# Patient Record
Sex: Female | Born: 1960 | Hispanic: Yes | Marital: Single | Smoking: Never smoker
Health system: Southern US, Community
[De-identification: ages and names within clinical notes are randomized; demographics above are authoritative.]

## PROBLEM LIST (undated history)

## (undated) DIAGNOSIS — I10 Essential (primary) hypertension: Secondary | ICD-10-CM

## (undated) DIAGNOSIS — E119 Type 2 diabetes mellitus without complications: Secondary | ICD-10-CM

---

## 2017-07-02 DIAGNOSIS — G060 Intracranial abscess and granuloma: Secondary | ICD-10-CM

## 2017-07-02 HISTORY — DX: Intracranial abscess and granuloma: G06.0

## 2017-07-02 HISTORY — PX: CRANIOTOMY: SHX93

## 2017-08-16 NOTE — Discharge Summary (Signed)
 Neurosurgery Discharge Summary  Patient ID: Stephanie [name] [id number] y.o. 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 Cerebral abscess    Past Medical History:  Diagnosis Date  . Cerebral abscess   . Diabetes mellitus (HCC) 2003   Type 2 DM   . Hypertension   . Leg DVT (deep venous thromboembolism), acute, right (HCC)     Discharged Condition: stable  Indications for Admission: Stephanie [name] is a 57 y.o. female with recent right parieto-occipital crani for evacuation of K. Pneumonia abscess on 1/17 presenting with two right pareital intracranial abscess, the deepest of which communicates with the right lateral ventricle causing ventriculitis. On exam she is having bad headaches and has a left lower quadrant field cut.  Hospital Course:  She was admitted on 07/31/17 and was taken emergently to the OR for Right-sided craniotomy for evacuation of recurrent right-sided parietal occipital brain abscess causing brain compression. She tolerated the procedure well without complications. She was transferred to PACU then neuro ICU. Consults were placed to ID as well as Glucose Management Team. There was concern for medical non-compliance. However, family assured staff that patient did not miss any home antibiotic doses. Once her medical issues were controlled, she was moved out of the ICU on 08/02/17. PT/OT/ST consults were obtained. IPR was advised and a consult was placed to [facility] (however, she was later denied due to lack of 24 hour  assistance once home). She developed an acute right calf DVT. Hematology consult was obtained and recommended 3 months of anticoagulation. Patient was started on Lovenox . Consult was placed to dentistry for evaluation of dentition as possible source of infection. Dentistry did feel her teeth were the source of the infection but did eventually extract 4 teeth while she was hospitalized. Therapy continued to work with her. She was entually placed on the difficult to place list with [facility] (but also preferred transfer to [facility] to be closer to family while awaiting SNF placement). Patient was accepted in transfer to [facility] by Dr. Medford Chain and was transferred once a bed was available on 08/21/2017.  Final Endocrine recs:  -FSBG:ac and hs  -Blood glucose target:140-180 mg/dl  -Discontinue home diabetes medications -Diabetes Medication Regimen : Continue basal Lantus insulin   25 units every evening  Increased prandial Lispro insulin  to 18 units pc based on % of meals consumed Continue Lispro correction insulin  2-12 units pc for bg > 180 mg/dl  -Hypoglycemia Protocol -Nutrition: House  diet   Final ID recs:  - Continue on IV ceftriaxone 2g q12 hours to aim targeted therapy for prior detected K. Pneumoniae - Continue PO Flagyl at 500 mg TID for broadened out empiric anaerobic coverage - Duration for drained cerebral abscess is generally treated for 6-8 weeks, may require longer duration given her complicated course. Final duration based upon clinical and radiographic response. (tentative 6 week end date would be 09/11/17).  She would need set up for repeat brain imaging, preferred with contrast, at end of 5th week of IV therapy  to assess response and to see if needs further prolong course.  -She needs to avoid alcohol use while on PO  flagyl Patient would need weekly CBC, CMP monitoring Please contact OPAT nurse (Monday-Friday) when nearing discharge for this information. Phone [phone] or  page [phone].  Procedures/Surgeries performed during hospitalization: Right craniotomy by Dr. Michiel on 07/31/17 Extraction of 4 teeth (#1,2,13, 19) by dentistry on 08/08/17  Discharge Exam: Alert, fully oriented, NAD PERRL Face: symmetric Tongue: midline Moves arms and legs well with full strength throughout Incision: well-healed  Disposition: [facility]  Patient Instructions:  Current Discharge Medication List    START taking these medications   Details  acetaminophen  (TYLENOL ) 325 MG tablet Take 2 tablets (650 mg total) by mouth every 6 (six) hours.    cefTRIAXone 2 g in sodium chloride  0.9 % 0.9 % 100 mL IVPB Infuse 2 g into the vein every 12 hours. Infuse over 30 minutes. Continue through 09/11/2017    enoxaparin  (LOVENOX ) 120 mg/0.8 mL injection *ANTICOAGULANT* Inject 0.8 mLs (120 mg total) into the skin every 12 hours.    insulin  glargine (LANTUS) 100 unit/mL (3 mL) injection Inject 25 Units into the skin every evening.    !! insulin  lispro (HUMALOG) 100 unit/mL injection Inject 2-12 Units into the skin 3 (times) daily after meals. Check blood sugar prior to meal. If patient has eaten prior to finger stick, call provider for insulin  coverage. Give corrective insulin  dose based on FSBS even when there are NO SCHEDULED INSULIN  DOSES.   If patient is NPO, DO NOT HOLD. BG = 180-200      2 units BG = 201-250  5 units      BG = 251-300  7 units BG = 301-350  10 units BG = 351-400  12 units BG > 400  Call provider    !! insulin  lispro (HUMALOG) 100 unit/mL injection Inject 18 Units into the skin 3 (times) daily after meals.    metroNIDAZOLE (FLAGYL) 500 MG tablet Take 1 tablet (500 mg total) by mouth every 8 hours. Through 09/11/2017    !! nystatin (MYCOSTATIN) 100,000 unit/gram cream Apply topically 3 times daily.    !! nystatin (MYCOSTATIN) 100,000 unit/gram cream Apply topically as needed. After each episode of incontinence    potassium chloride  ER  (KLOR-CON -M, K-DUR) 20 MEQ extended release tablet Take 1 tablet (20 mEq total) by mouth daily.    senna-docusate (PERICOLACE, SENOKOT-S) 8.6-50 mg per tablet Take 2 tablets by mouth nightly.     !! - Potential duplicate medications found. Please discuss with provider.    CONTINUE these medications which have CHANGED   Details  levETIRacetam  (KEPPRA ) 500 MG tablet Take 1 tablet (500 mg total) by mouth 2 times daily. Refills: 0    losartan  (COZAAR ) 50 MG tablet Take 0.5 tablets (25 mg total) by mouth daily.      STOP taking these medications     cefTRIAXone (ROCEPHIN) 2 gram injection      HYDROcodone-acetaminophen  (NORCO) 5-325 mg per tablet      ibuprofen (ADVIL,MOTRIN) 200 MG tablet      insulin  NPH (HUMULIN  N,NOVOLIN N) 100 unit/mL injection      metFORMIN (GLUCOPHAGE) 1000 MG tablet           Discharge Orders and Instructions    Diet At Discharge    Complete by:  As directed    Recommended diet at discharge:  Other   Other recommended diet:  diabetic soft mechanical diet   Activity At Discharge    Complete by:  As directed    Recommended activity at discharge:  Activity as tolerated   Contact Us  / Call Us  To Schedule An Appointment    Complete by:  As directed    Call:  The Neurosurgery Office   Phone:  [phone]   Call if:  questions or concerns   Additional Discharge Instructions    Complete by:  As directed    Monitor weekly labs while receiving antibiotics            Future Appointments      Provider Department Dept Phone Center   09/03/2017 3:15 PM [facility] NEUROSURGERY Neurosurgery - 5th fl [place] [phone] JT          Electronically signed by: Stephanie JONETTA Null, PA-C 08/21/17 1549

## 2017-08-21 NOTE — Progress Notes (Signed)
 Neurosurgery Progress Note  Hospital Day: Hospital Day: 22    Subjective:  No acute events overnight, denies HA, reports she has been ambulating  Objective:   Vitals: Temp:  [97.6 F (36.4 C)-98.6 F (37 C)] 98.2 F (36.8 C) Pulse:  [102-119] 115 Resp:  [14-18] 18 BP: (115-141)/(52-89) 141/86 SpO2:  [94 %-99 %] 94 %  Intake/Output Summary (Last 24 hours) at 08/21/17 9078 Last data filed at 08/21/17 0848  Gross per 24 hour  Intake              360 ml  Output                0 ml  Net              360 ml    Physical Exam: Middle aged female, lying in bed in no acute distress Awake, alert and oriented x 4 PERRL, EOMI Face symmetric Strength 5/5 x 4 No drift Incision well healed  Assessment/Plan: 57 y.o. female with recurrent abscess s/p crani, progressing well and awaiting placement  - Will continue current Abx regime - Will obtain CBC and BMP today as part of continued monitoring while on Abx - Continue ambulation - Please call with questions or concerns ([phone] or [phone] after 5pm and on weekends)  Electronically signed by: Rosine Lucas Jacklyn Mickey., MD 08/21/2017 9:23 AM      Electronically signed by: Rosine Lucas Jacklyn Mickey., MD Resident 08/21/17 [phone]

## 2019-06-23 ENCOUNTER — Inpatient Hospital Stay (HOSPITAL_COMMUNITY)
Admission: AD | Admit: 2019-06-23 | Discharge: 2019-07-02 | DRG: 177 | Disposition: A | Discharge status: Home / Self Care | Payer: HRSA Program | Source: Other Acute Inpatient Hospital | Attending: Family Medicine | Admitting: Family Medicine

## 2019-06-23 DIAGNOSIS — Z6841 Body Mass Index (BMI) 40.0 and over, adult: Secondary | ICD-10-CM

## 2019-06-23 DIAGNOSIS — R7401 Elevation of levels of liver transaminase levels: Secondary | ICD-10-CM | POA: Diagnosis not present

## 2019-06-23 DIAGNOSIS — R0600 Dyspnea, unspecified: Secondary | ICD-10-CM

## 2019-06-23 DIAGNOSIS — I5040 Unspecified combined systolic (congestive) and diastolic (congestive) heart failure: Secondary | ICD-10-CM | POA: Diagnosis present

## 2019-06-23 DIAGNOSIS — I2510 Atherosclerotic heart disease of native coronary artery without angina pectoris: Secondary | ICD-10-CM | POA: Diagnosis present

## 2019-06-23 DIAGNOSIS — J9601 Acute respiratory failure with hypoxia: Secondary | ICD-10-CM | POA: Diagnosis present

## 2019-06-23 DIAGNOSIS — J1289 Other viral pneumonia: Secondary | ICD-10-CM | POA: Diagnosis present

## 2019-06-23 DIAGNOSIS — U071 COVID-19: Principal | ICD-10-CM | POA: Diagnosis present

## 2019-06-23 DIAGNOSIS — Z794 Long term (current) use of insulin: Secondary | ICD-10-CM | POA: Diagnosis not present

## 2019-06-23 DIAGNOSIS — Z8661 Personal history of infections of the central nervous system: Secondary | ICD-10-CM

## 2019-06-23 DIAGNOSIS — E1165 Type 2 diabetes mellitus with hyperglycemia: Secondary | ICD-10-CM | POA: Diagnosis not present

## 2019-06-23 DIAGNOSIS — R739 Hyperglycemia, unspecified: Secondary | ICD-10-CM | POA: Diagnosis not present

## 2019-06-23 DIAGNOSIS — T380X5A Adverse effect of glucocorticoids and synthetic analogues, initial encounter: Secondary | ICD-10-CM | POA: Diagnosis not present

## 2019-06-23 DIAGNOSIS — I110 Hypertensive heart disease with heart failure: Secondary | ICD-10-CM | POA: Diagnosis present

## 2019-06-23 DIAGNOSIS — J9690 Respiratory failure, unspecified, unspecified whether with hypoxia or hypercapnia: Secondary | ICD-10-CM

## 2019-06-23 DIAGNOSIS — J1282 Pneumonia due to coronavirus disease 2019: Secondary | ICD-10-CM | POA: Diagnosis present

## 2019-06-23 DIAGNOSIS — E119 Type 2 diabetes mellitus without complications: Secondary | ICD-10-CM | POA: Diagnosis not present

## 2019-06-23 HISTORY — DX: Essential (primary) hypertension: I10

## 2019-06-23 HISTORY — DX: Type 2 diabetes mellitus without complications: E11.9

## 2019-06-23 LAB — CBC WITH DIFFERENTIAL/PLATELET
Abs Immature Granulocytes: 0.02 10*3/uL (ref 0.00–0.07)
Basophils Absolute: 0 10*3/uL (ref 0.0–0.1)
Basophils Relative: 0 %
Eosinophils Absolute: 0 10*3/uL (ref 0.0–0.5)
Eosinophils Relative: 0 %
HCT: 37.6 % (ref 36.0–46.0)
Hemoglobin: 12.4 g/dL (ref 12.0–15.0)
Immature Granulocytes: 0 %
Lymphocytes Relative: 19 %
Lymphs Abs: 0.9 10*3/uL (ref 0.7–4.0)
MCH: 27.9 pg (ref 26.0–34.0)
MCHC: 33 g/dL (ref 30.0–36.0)
MCV: 84.5 fL (ref 80.0–100.0)
Monocytes Absolute: 0.2 10*3/uL (ref 0.1–1.0)
Monocytes Relative: 4 %
Neutro Abs: 3.5 10*3/uL (ref 1.7–7.7)
Neutrophils Relative %: 77 %
Platelets: 196 10*3/uL (ref 150–400)
RBC: 4.45 MIL/uL (ref 3.87–5.11)
RDW: 14.5 % (ref 11.5–15.5)
WBC: 4.6 10*3/uL (ref 4.0–10.5)
nRBC: 0 % (ref 0.0–0.2)

## 2019-06-23 MED ORDER — ENOXAPARIN SODIUM 60 MG/0.6ML ~~LOC~~ SOLN
60.0000 mg | SUBCUTANEOUS | Status: DC
  Administered 2019-06-24 – 2019-07-02 (×9): 60 mg via SUBCUTANEOUS
  Filled 2019-06-23 (×9): qty 0.6

## 2019-06-24 ENCOUNTER — Inpatient Hospital Stay (HOSPITAL_COMMUNITY): Payer: HRSA Program

## 2019-06-24 ENCOUNTER — Other Ambulatory Visit: Payer: Self-pay

## 2019-06-24 ENCOUNTER — Encounter (HOSPITAL_COMMUNITY): Payer: Self-pay | Admitting: Family Medicine

## 2019-06-24 DIAGNOSIS — R7401 Elevation of levels of liver transaminase levels: Secondary | ICD-10-CM | POA: Insufficient documentation

## 2019-06-24 DIAGNOSIS — E119 Type 2 diabetes mellitus without complications: Secondary | ICD-10-CM

## 2019-06-24 DIAGNOSIS — Z794 Long term (current) use of insulin: Secondary | ICD-10-CM

## 2019-06-24 DIAGNOSIS — U071 COVID-19: Principal | ICD-10-CM

## 2019-06-24 DIAGNOSIS — J1289 Other viral pneumonia: Secondary | ICD-10-CM

## 2019-06-24 DIAGNOSIS — Z6841 Body Mass Index (BMI) 40.0 and over, adult: Secondary | ICD-10-CM

## 2019-06-24 LAB — GLUCOSE, CAPILLARY
Glucose-Capillary: 221 mg/dL — ABNORMAL HIGH (ref 70–99)
Glucose-Capillary: 257 mg/dL — ABNORMAL HIGH (ref 70–99)
Glucose-Capillary: 297 mg/dL — ABNORMAL HIGH (ref 70–99)
Glucose-Capillary: 331 mg/dL — ABNORMAL HIGH (ref 70–99)
Glucose-Capillary: 360 mg/dL — ABNORMAL HIGH (ref 70–99)

## 2019-06-24 LAB — HIV ANTIBODY (ROUTINE TESTING W REFLEX): HIV Screen 4th Generation wRfx: NONREACTIVE

## 2019-06-24 LAB — COMPREHENSIVE METABOLIC PANEL
ALT: 72 U/L — ABNORMAL HIGH (ref 0–44)
AST: 78 U/L — ABNORMAL HIGH (ref 15–41)
Albumin: 2.9 g/dL — ABNORMAL LOW (ref 3.5–5.0)
Alkaline Phosphatase: 182 U/L — ABNORMAL HIGH (ref 38–126)
Anion gap: 9 (ref 5–15)
BUN: 7 mg/dL (ref 6–20)
CO2: 22 mmol/L (ref 22–32)
Calcium: 8 mg/dL — ABNORMAL LOW (ref 8.9–10.3)
Chloride: 103 mmol/L (ref 98–111)
Creatinine, Ser: 0.79 mg/dL (ref 0.44–1.00)
GFR calc Af Amer: >60 mL/min (ref >60)
GFR calc non Af Amer: >60 mL/min (ref >60)
Glucose, Bld: 290 mg/dL — ABNORMAL HIGH (ref 70–99)
Potassium: 4 mmol/L (ref 3.5–5.1)
Sodium: 134 mmol/L — ABNORMAL LOW (ref 135–145)
Total Bilirubin: 0.6 mg/dL (ref 0.3–1.2)
Total Protein: 6.5 g/dL (ref 6.5–8.1)

## 2019-06-24 LAB — FERRITIN: Ferritin: 322 ng/mL — ABNORMAL HIGH (ref 11–307)

## 2019-06-24 LAB — D-DIMER, QUANTITATIVE: D-Dimer, Quant: 0.64 ug/mL-FEU — ABNORMAL HIGH (ref 0.00–0.50)

## 2019-06-24 LAB — HEMOGLOBIN A1C
Hgb A1c MFr Bld: 9.2 % — ABNORMAL HIGH (ref 4.8–5.6)
Mean Plasma Glucose: 217.34 mg/dL

## 2019-06-24 LAB — C-REACTIVE PROTEIN: CRP: 16.8 mg/dL — ABNORMAL HIGH (ref <1.0)

## 2019-06-24 LAB — ABO/RH: ABO/RH(D): O NEG

## 2019-06-24 MED ORDER — SODIUM CHLORIDE 0.9 % IV SOLN
100.0000 mg | Freq: Every day | INTRAVENOUS | Status: AC
  Administered 2019-06-24 – 2019-06-27 (×4): 100 mg via INTRAVENOUS
  Filled 2019-06-24 (×4): qty 20

## 2019-06-24 MED ORDER — GUAIFENESIN-DM 100-10 MG/5ML PO SYRP: 5.0000 mL | ORAL_SOLUTION | ORAL | Status: DC | PRN

## 2019-06-24 MED ORDER — ASCORBIC ACID 500 MG PO TABS
500.0000 mg | ORAL_TABLET | Freq: Every day | ORAL | Status: DC
  Administered 2019-06-24 – 2019-07-02 (×9): 500 mg via ORAL
  Filled 2019-06-24 (×9): qty 1

## 2019-06-24 MED ORDER — ORAL CARE MOUTH RINSE
15.0000 mL | Freq: Two times a day (BID) | OROMUCOSAL | Status: DC
  Administered 2019-06-24 – 2019-07-02 (×13): 15 mL via OROMUCOSAL

## 2019-06-24 MED ORDER — ZINC SULFATE 220 (50 ZN) MG PO CAPS
220.0000 mg | ORAL_CAPSULE | Freq: Every day | ORAL | Status: DC
  Administered 2019-06-24 – 2019-07-02 (×9): 220 mg via ORAL
  Filled 2019-06-24 (×9): qty 1

## 2019-06-24 MED ORDER — HYDROCOD POLST-CPM POLST ER 10-8 MG/5ML PO SUER
5.0000 mL | Freq: Two times a day (BID) | ORAL | Status: DC
  Administered 2019-06-24 – 2019-07-02 (×17): 5 mL via ORAL
  Filled 2019-06-24 (×17): qty 5

## 2019-06-24 MED ORDER — SODIUM CHLORIDE 0.9 % IV BOLUS
500.0000 mL | Freq: Once | INTRAVENOUS | Status: AC
  Administered 2019-06-24: 02:00:00 500 mL via INTRAVENOUS

## 2019-06-24 MED ORDER — INSULIN ASPART 100 UNIT/ML ~~LOC~~ SOLN
0.0000 [IU] | Freq: Three times a day (TID) | SUBCUTANEOUS | Status: DC
  Administered 2019-06-24: 15 [IU] via SUBCUTANEOUS
  Administered 2019-06-24: 09:00:00 7 [IU] via SUBCUTANEOUS
  Administered 2019-06-24 – 2019-06-25 (×2): 11 [IU] via SUBCUTANEOUS
  Administered 2019-06-25: 20 [IU] via SUBCUTANEOUS
  Administered 2019-06-25: 17:00:00 15 [IU] via SUBCUTANEOUS
  Administered 2019-06-26: 08:00:00 11 [IU] via SUBCUTANEOUS
  Administered 2019-06-26 (×2): 20 [IU] via SUBCUTANEOUS
  Administered 2019-06-27: 15 [IU] via SUBCUTANEOUS
  Administered 2019-06-27: 11 [IU] via SUBCUTANEOUS
  Administered 2019-06-27: 17:00:00 15 [IU] via SUBCUTANEOUS
  Administered 2019-06-28: 20 [IU] via SUBCUTANEOUS
  Administered 2019-06-28: 4 [IU] via SUBCUTANEOUS
  Administered 2019-06-28 – 2019-06-29 (×2): 11 [IU] via SUBCUTANEOUS
  Administered 2019-06-29: 7 [IU] via SUBCUTANEOUS
  Administered 2019-06-29 – 2019-06-30 (×2): 20 [IU] via SUBCUTANEOUS
  Administered 2019-06-30: 12:00:00 15 [IU] via SUBCUTANEOUS
  Administered 2019-06-30: 7 [IU] via SUBCUTANEOUS
  Administered 2019-07-01: 20 [IU] via SUBCUTANEOUS
  Administered 2019-07-01 – 2019-07-02 (×2): 7 [IU] via SUBCUTANEOUS

## 2019-06-24 MED ORDER — INFLUENZA VAC SPLIT QUAD 0.5 ML IM SUSY
0.5000 mL | PREFILLED_SYRINGE | INTRAMUSCULAR | Status: DC
  Filled 2019-06-24: qty 0.5

## 2019-06-24 MED ORDER — INSULIN ASPART 100 UNIT/ML ~~LOC~~ SOLN
5.0000 [IU] | Freq: Three times a day (TID) | SUBCUTANEOUS | Status: DC
  Administered 2019-06-24 – 2019-06-27 (×9): 5 [IU] via SUBCUTANEOUS

## 2019-06-24 MED ORDER — DEXAMETHASONE SODIUM PHOSPHATE 10 MG/ML IJ SOLN
6.0000 mg | Freq: Every day | INTRAMUSCULAR | Status: DC
  Administered 2019-06-24 – 2019-07-02 (×9): 6 mg via INTRAVENOUS
  Filled 2019-06-24 (×9): qty 1

## 2019-06-24 MED ORDER — INSULIN DETEMIR 100 UNIT/ML ~~LOC~~ SOLN
20.0000 [IU] | Freq: Two times a day (BID) | SUBCUTANEOUS | Status: DC
  Administered 2019-06-24 (×3): 20 [IU] via SUBCUTANEOUS
  Filled 2019-06-24 (×5): qty 0.2

## 2019-06-24 NOTE — H&P (Signed)
History and Physical    Stephanie [name] TDV:[id number] DOB: 1961/01/05 DOA: 06/23/2019  PCP:PCP at [facility]  Patient coming from: Transfer from [facility], initially from home  I have personally briefly reviewed patient's old medical records in [facility]  Chief Complaint: Shortness of breath, COVID pneumonia   HPI: Stephanie [name] is a 58 y.o. female with medical history significant of insulin-dependent type 2 diabetes, hypertension and morbid obesity who presents as a transfer from [facility] for multifocal pneumonia secondary to Covid infection. Tele-Spanish interpreter assisted with HPI.  Patient started to have symptoms about a week ago.  She first noticed headache and daily diarrhea.  Then progressively lost her sense of taste and smell.  She also had persistent fever around 100.1.  She presented to [facility] about 3 days ago and was tested positive for Covid on 12/19.  She done progressively noticed worsening shortness of breath on 12/22 and presented to [facility] where she was found to be hypoxic down to 80s and required up to 2.5 L via nasal cannula. Patient reports that her entire family is Covid positive after gathering with some neighbors last week. She denies tobacco, alcohol or illicit drug use.  She initially presented to [facility] with a temperature of 100, sinus tachycardia of 101, oxygen saturation of 88% on room air and required up to 2.5 L.  Blood pressure of 127/60. CXR showed multifocal pneumonia.  D-dimer was 895, procalcitonin of 0.31.  Review of Systems:  Constitutional: + Fever ENT/Mouth: No sore throat, No Rhinorrhea Eyes: No Vision Changes Cardiovascular: no Chest Pain,+ SOB Respiratory: + Cough,  Gastrointestinal: No Nausea, No Vomiting, No Diarrhea, No Constipation, pain with coughing Genitourinary: no Urinary Incontinence Musculoskeletal: + Myalgias Skin: No Skin Lesions, No Pruritus, Neuro: no Weakness, No  Numbness,   Psych:  no decrease appetite Heme/Lymph: No Bruising, No Bleeding    Prior to Admission medications   Not on File    Physical Exam: Vitals:   06/23/19 2305 06/23/19 2306 06/24/19 0000  BP:  134/67 136/63  Pulse: 82 82 86  Resp: (!) 22 (!) 29 (!) 32  SpO2: 92% 90%   Weight:  123.2 kg   Height:  5' (1.524 m)     Constitutional: NAD, calm, comfortable, morbidly obese female laying flat in bed Vitals:   06/23/19 2305 06/23/19 2306 06/24/19 0000  BP:  134/67 136/63  Pulse: 82 82 86  Resp: (!) 22 (!) 29 (!) 32  SpO2: 92% 90%   Weight:  123.2 kg   Height:  5' (1.524 m)    Eyes: PERRL, lids and conjunctivae normal ENMT: Mucous membranes are moist. Neck: normal, supple Respiratory: clear to auscultation bilaterally, no wheezing, no crackles although difficult to auscultate given body habitus.  On 2 L via nasal cannula and had some dyspnea in between sentences. No accessory muscle use.  Cardiovascular: Regular rate and rhythm, no murmurs / rubs / gallops. No extremity edema.  Abdomen: no tenderness. Bowel sounds positive.  Musculoskeletal: no clubbing / cyanosis. No joint deformity upper and lower extremities. Good ROM, no contractures. Normal muscle tone.  Skin: Healing vesicles from recent shingles infection on left flank Neurologic: CN 2-12 grossly intact. Sensation intact. Strength 5/5 in all 4.  Psychiatric: Normal judgment and insight. Alert and oriented x 3. Normal mood.     Labs on Admission: I have personally reviewed following labs and imaging studies  CBC: Recent Labs  Lab 06/23/19 2329  WBC 4.6  NEUTROABS  3.5  HGB 12.4  HCT 37.6  MCV 84.5  PLT 350   Basic Metabolic Panel: No results for input(s): NA, K, CL, CO2, GLUCOSE, BUN, CREATININE, CALCIUM, MG, PHOS in the last 168 hours. GFR: CrCl cannot be calculated (No successful lab value found.). Liver Function Tests: No results for input(s): AST, ALT, ALKPHOS, BILITOT, PROT, ALBUMIN in the last  168 hours. No results for input(s): LIPASE, AMYLASE in the last 168 hours. No results for input(s): AMMONIA in the last 168 hours. Coagulation Profile: No results for input(s): INR, PROTIME in the last 168 hours. Cardiac Enzymes: No results for input(s): CKTOTAL, CKMB, CKMBINDEX, TROPONINI in the last 168 hours. BNP (last 3 results) No results for input(s): PROBNP in the last 8760 hours. HbA1C: No results for input(s): HGBA1C in the last 72 hours. CBG: No results for input(s): GLUCAP in the last 168 hours. Lipid Profile: No results for input(s): CHOL, HDL, LDLCALC, TRIG, CHOLHDL, LDLDIRECT in the last 72 hours. Thyroid Function Tests: No results for input(s): TSH, T4TOTAL, FREET4, T3FREE, THYROIDAB in the last 72 hours. Anemia Panel: No results for input(s): VITAMINB12, FOLATE, FERRITIN, TIBC, IRON, RETICCTPCT in the last 72 hours. Urine analysis: No results found for: COLORURINE, APPEARANCEUR, LABSPEC, PHURINE, GLUCOSEU, HGBUR, BILIRUBINUR, KETONESUR, PROTEINUR, UROBILINOGEN, NITRITE, LEUKOCYTESUR  Radiological Exams on Admission: No results found.    Assessment/Plan Acute hypoxic respiratory failure secondary to multifocal pneumonia due to COVID infection Continue Decadron-started on 12/22 Continue remdesivir day 2/5   Insulin-dependent type 2 diabetes -Start with 20 units twice daily Levemir and resistance sliding scale while on steroids  Transaminitis -Likely elevated in the setting of Covid infection  Morbid obesity -Complicates comorbidities     DVT prophylaxis:.Lovenox Code Status:Full Family Communication: Plan discussed with patient at bedside  disposition Plan: Home with at least 2 midnight stays  Consults called:  Admission status: inpatient   Damoney Julia T Aritza Brunet DO [facility]   If 7PM-7AM, please contact night-coverage [email or web address] Password [email or web address]  06/24/2019, 12:20 AM

## 2019-06-24 NOTE — Plan of Care (Signed)

## 2019-06-24 NOTE — Progress Notes (Signed)
   06/23/19 2306  MEWS Score  Resp (!) 29  ECG Heart Rate 82  Pulse Rate 82  BP 134/67  SpO2 90 %  O2 Device Nasal Cannula  O2 Flow Rate (L/min) 2 L/min  MEWS Score  MEWS RR 2  MEWS Pulse 0  MEWS Systolic 0  MEWS LOC 0  MEWS Temp 0  MEWS Score 2  MEWS Score Color Yellow  MEWS Assessment  Is this an acute change? Yes  Provider Notification  Provider Name/Title Ileene Musa D.O.  Date Provider Notified 06/24/19  Time Provider Notified 0000  Notification Type Face-to-face  Response See new orders  Date of Provider Response 06/24/19  Time of Provider Response 0000

## 2019-06-24 NOTE — Progress Notes (Signed)
PROGRESS NOTE    Stephanie [name]  XHB:[id number] DOB: 1961-04-16 DOA: 06/23/2019 PCP: No primary care provider on file.    Brief Narrative:  58 year old female who presented with dyspnea.  She does have significant past medical history for type 2 diabetes mellitus, hypertension and morbid obesity.  Patient was transferred from [facility] after a diagnosis of multifocal pneumonia due to SARS COVID-19.  She tested positive for SARS COVID-19 December 19, her symptoms consistent with headache, diarrhea, loss of sense of smell and taste, fever and progressive/worsening dyspnea.  December 22 she was evaluated at [facility], her oximetry was 80%, her temperature was 100 F, heart rate 101, blood pressure 127/60.  She was transferred to [facility] on December 23 for further management at the time of her transfer her blood pressure was 134/67, pulse rate 82, respirate 22-29, oxygen saturation 90% on supplemental oxygen, her lungs are clear to auscultation bilaterally, heart S1-S2 present rhythmic, abdomen was soft, no lower extremity edema.     Assessment & Plan:   Principal Problem:   Pneumonia due to COVID-19 virus Active Problems:   Transaminitis   Type 2 diabetes mellitus without complication, with long-term current use of insulin (HCC)   1.  Acute hypoxic respiratory failure due to SARS COVID-19 viral pneumonia.  RR: 20  Pulse oxymetry: 92%  Fi02: 2 L/ min per [place]  COVID-19 Labs  Recent Labs    06/24/19 1011  DDIMER 0.64*  FERRITIN 322*  CRP 16.8*    No results found for: SARSCOV2NAA  Chest film personally reviewed noted bilateral interstitial infiltrates, right upper and lower lobe and left lower lobe.   Will continue medical therapy with Remdesivir #2/5 (AST 78, ALT 72), continue systemic corticosteroids with dexemethasone. Continue with antitussive agents and airway clearing techniques with flutter valve and incentive spirometer. Continue vitamin c and  zinc.    2.  Uncontrolled T2DM (Hgb A1c 9,2), with steroid induced hyperglycemia. Capillary glucose this am 257, 221, 297, will continue insulin sliding scale and basal insulin 20 units levemir bid. Will add 5 units for meal coverage. Will need further insulin adjustments.   3. Morbid obesity. Will need outpatient follow up. Positive risk factor for severe COVID 19.     DVT prophylaxis: enoxaparin   Code Status:  full Family Communication: no family at the bedside  Disposition Plan/ discharge barriers:  Pending clinical improvement.         Subjective: Patient is feeling better, continue to have dyspnea and cough, low appetite, no nausea or vomiting, no chest pain.   Objective: Vitals:   06/24/19 0430 06/24/19 0534 06/24/19 0536 06/24/19 0800  BP:  (!) 102/45 (!) 102/45 104/87  Pulse: 71 81 79 85  Resp: 20 18 (!) 25 20  Temp:  99.6 F (37.6 C)  98.5 F (36.9 C)  TempSrc:  Oral  Oral  SpO2:  91% 93% 92%  Weight:      Height:        Intake/Output Summary (Last 24 hours) at 06/24/2019 0843 Last data filed at 06/24/2019 0315 Gross per 24 hour  Intake 680.18 ml  Output --  Net 680.18 ml   Filed Weights   06/23/19 2306 06/24/19 0230  Weight: 123.2 kg 123 kg    Examination:   General: Not in pain, mild dyspnea, deconditioned  Neurology: Awake and alert, non focal  E ENT: mild pallor, no icterus, oral mucosa moist Cardiovascular: No JVD. S1-S2 present, rhythmic, no gallops, rubs, or murmurs. Trace  non pitiing lower extremity edema. Pulmonary: positive breath sounds bilaterally. Gastrointestinal. Abdomen with no organomegaly, non tender, no rebound or guarding Skin. No rashes Musculoskeletal: no joint deformities     Data Reviewed: I have personally reviewed following labs and imaging studies  CBC: Recent Labs  Lab 06/23/19 2329  WBC 4.6  NEUTROABS 3.5  HGB 12.4  HCT 37.6  MCV 84.5  PLT 196   Basic Metabolic Panel: Recent Labs  Lab 06/23/19 2329   NA 134*  K 4.0  CL 103  CO2 22  GLUCOSE 290*  BUN 7  CREATININE 0.79  CALCIUM 8.0*   GFR: Estimated Creatinine Clearance: 90.3 mL/min (by C-G formula based on SCr of 0.79 mg/dL). Liver Function Tests: Recent Labs  Lab 06/23/19 2329  AST 78*  ALT 72*  ALKPHOS 182*  BILITOT 0.6  PROT 6.5  ALBUMIN 2.9*   No results for input(s): LIPASE, AMYLASE in the last 168 hours. No results for input(s): AMMONIA in the last 168 hours. Coagulation Profile: No results for input(s): INR, PROTIME in the last 168 hours. Cardiac Enzymes: No results for input(s): CKTOTAL, CKMB, CKMBINDEX, TROPONINI in the last 168 hours. BNP (last 3 results) No results for input(s): PROBNP in the last 8760 hours. HbA1C: Recent Labs    06/23/19 2329  HGBA1C 9.2*   CBG: Recent Labs  Lab 06/24/19 0040 06/24/19 0758  GLUCAP 257* 221*   Lipid Profile: No results for input(s): CHOL, HDL, LDLCALC, TRIG, CHOLHDL, LDLDIRECT in the last 72 hours. Thyroid Function Tests: No results for input(s): TSH, T4TOTAL, FREET4, T3FREE, THYROIDAB in the last 72 hours. Anemia Panel: No results for input(s): VITAMINB12, FOLATE, FERRITIN, TIBC, IRON, RETICCTPCT in the last 72 hours.    Radiology Studies: I have reviewed all of the imaging during this hospital visit personally     Scheduled Meds: . dexamethasone (DECADRON) injection  6 mg Intravenous Daily  . enoxaparin (LOVENOX) injection  60 mg Subcutaneous Q24H  . [START ON 06/25/2019] influenza vac split quadrivalent PF  0.5 mL Intramuscular Tomorrow-1000  . insulin aspart  0-20 Units Subcutaneous TID WC  . insulin detemir  20 Units Subcutaneous BID  . mouth rinse  15 mL Mouth Rinse BID   Continuous Infusions: . remdesivir 100 mg in NS 100 mL 100 mg (06/24/19 0827)     LOS: 1 day        Nicol Herbig Annett Gula, MD

## 2019-06-24 NOTE — Progress Notes (Signed)
   06/24/19 0036  MEWS Score  Temp (!) 101.1 F (38.4 C)  MEWS Score  MEWS RR 2  MEWS Pulse 0  MEWS Systolic 0  MEWS LOC 0  MEWS Temp 1  MEWS Score 3  MEWS Score Color Yellow  MEWS Assessment  Is this an acute change? No  Provider Notification  Provider Name/Title Kennon Holter, NP  Date Provider Notified 06/24/19  Time Provider Notified 0100 ((Approximate, paged x2))  Notification Type Page  Notification Reason Other (Comment) (Temp 101.1. No Tylenol orders.)  Response See new orders (IV NS bolus. No order for Tylenol.)  Date of Provider Response 06/24/19  Time of Provider Response 0154   Will continue to monitor.

## 2019-06-25 LAB — GLUCOSE, CAPILLARY
Glucose-Capillary: 289 mg/dL — ABNORMAL HIGH (ref 70–99)
Glucose-Capillary: 351 mg/dL — ABNORMAL HIGH (ref 70–99)
Glucose-Capillary: 314 mg/dL — ABNORMAL HIGH (ref 70–99)
Glucose-Capillary: 340 mg/dL — ABNORMAL HIGH (ref 70–99)

## 2019-06-25 LAB — FERRITIN: Ferritin: 424 ng/mL — ABNORMAL HIGH (ref 11–307)

## 2019-06-25 LAB — COMPREHENSIVE METABOLIC PANEL
ALT: 88 U/L — ABNORMAL HIGH (ref 0–44)
AST: 101 U/L — ABNORMAL HIGH (ref 15–41)
Albumin: 2.8 g/dL — ABNORMAL LOW (ref 3.5–5.0)
Alkaline Phosphatase: 164 U/L — ABNORMAL HIGH (ref 38–126)
Anion gap: 11 (ref 5–15)
BUN: 18 mg/dL (ref 6–20)
CO2: 22 mmol/L (ref 22–32)
Calcium: 8.5 mg/dL — ABNORMAL LOW (ref 8.9–10.3)
Chloride: 104 mmol/L (ref 98–111)
Creatinine, Ser: 0.81 mg/dL (ref 0.44–1.00)
GFR calc Af Amer: >60 mL/min (ref >60)
GFR calc non Af Amer: >60 mL/min (ref >60)
Glucose, Bld: 343 mg/dL — ABNORMAL HIGH (ref 70–99)
Potassium: 4.4 mmol/L (ref 3.5–5.1)
Sodium: 137 mmol/L (ref 135–145)
Total Bilirubin: 0.2 mg/dL — ABNORMAL LOW (ref 0.3–1.2)
Total Protein: 6.1 g/dL — ABNORMAL LOW (ref 6.5–8.1)

## 2019-06-25 LAB — C-REACTIVE PROTEIN: CRP: 9.6 mg/dL — ABNORMAL HIGH (ref <1.0)

## 2019-06-25 LAB — D-DIMER, QUANTITATIVE: D-Dimer, Quant: 0.5 ug/mL-FEU (ref 0.00–0.50)

## 2019-06-25 MED ORDER — INSULIN DETEMIR 100 UNIT/ML ~~LOC~~ SOLN
30.0000 [IU] | Freq: Two times a day (BID) | SUBCUTANEOUS | Status: DC
  Administered 2019-06-25 – 2019-06-26 (×3): 30 [IU] via SUBCUTANEOUS
  Filled 2019-06-25 (×4): qty 0.3

## 2019-06-25 NOTE — Progress Notes (Addendum)
PROGRESS NOTE    Stephanie [name]  EXH:[id number] DOB: 1960/08/24 DOA: 06/23/2019 PCP: Patient, No Pcp Per    Brief Narrative:  57 year old female who presented with dyspnea.  She does have significant past medical history for type 2 diabetes mellitus, hypertension and morbid obesity.  Patient was transferred from [facility] after a diagnosis of multifocal pneumonia due to SARS COVID-19.    She tested positive for SARS COVID-19 December 19, her symptoms consistent with headache, diarrhea, loss of sense of smell and taste, fever and progressive/worsening dyspnea.  December 22 she was evaluated at [facility], her oximetry was 80%, her temperature was 100 F, heart rate 101, blood pressure 127/60.  She was transferred to [facility] on December 23 for further management at the time of her transfer her blood pressure was 134/67, pulse rate 82, respirate 22-29, oxygen saturation 90% on supplemental oxygen, her lungs were clear to auscultation bilaterally, heart S1-S2 present rhythmic, abdomen was soft, no lower extremity edema.  Chest film personally reviewed noted bilateral interstitial infiltrates, right upper and lower lobe and left lower lobe.  Patient has been responding well to medical therapy with Remdesivir and Dexamethasone.   Assessment & Plan:   Principal Problem:   Pneumonia due to COVID-19 virus Active Problems:   Transaminitis   Type 2 diabetes mellitus without complication, with long-term current use of insulin (HCC)    1.  Acute hypoxic respiratory failure due to SARS COVID-19 viral pneumonia.  RR: 19 to 24  Pulse oxymetry: 96%  Fi02: 4 L/ min  COVID-19 Labs  Recent Labs    06/24/19 1011 06/25/19 0355  DDIMER 0.64* 0.50  FERRITIN 322* 424*  CRP 16.8* 9.6*    No results found for: SARSCOV2NAA   Inflammatory markers trending down, with CRP down to 9,6.  Tolerating well medical therapy with Remdesivir #3/5 (AST 101, ALT 88), on systemic  corticosteroids with dexemethasone 6 mg IV q 24 H. On antitussive agents and airway clearing techniques with flutter valve and incentive spirometer. On vitamin c and zinc.   Out of bed to chair tid with meals and physical/ occupational therapy evaluations.    2.  Uncontrolled T2DM (Hgb A1c 9,2), with steroid induced hyperglycemia. Fasting glucose this am at 343, will increase basal insulin to 30 units bid. Patient is tolerating po well, will continue insulin sliding scale for glucose cover and monitoring and 5 units for pre-meal coverage.  3. Morbid obesity. Positive risk factor for severe COVID 19.  Outpatient follow up.      DVT prophylaxis: enoxaparin   Code Status:  full Family Communication: no family at the bedside  Disposition Plan/ discharge barriers:  Pending clinical improvement   Consultants:     Procedures:     Antimicrobials:       Subjective: Patient slowly improving, improved energy and decreased dyspnea, but not yet back to baseline. Positive cough. Positive new left upper abdominal pain, dull in nature, constant and not affected by meals or movement,   Objective: Vitals:   06/24/19 1241 06/24/19 1540 06/24/19 2000 06/25/19 0630  BP:   130/79 115/61  Pulse:   80 62  Resp:   (!) 26 19  Temp:  98.6 F (37 C) 98.8 F (37.1 C) 98.5 F (36.9 C)  TempSrc:  Oral Oral Oral  SpO2: 90% 94% 96%   Weight:      Height:        Intake/Output Summary (Last 24 hours) at 06/25/2019 3810 Last data filed  at 06/24/2019 2030 Gross per 24 hour  Intake 590.94 ml  Output 300 ml  Net 290.94 ml   Filed Weights   06/23/19 2306 06/24/19 0230  Weight: 123.2 kg 123 kg    Examination:   General: Not in pain or dyspnea, deconditioned  Neurology: Awake and alert, non focal  E ENT: mild pallor, no icterus, oral mucosa moist Cardiovascular: No JVD. S1-S2 present, rhythmic. No lower extremity edema. Pulmonary: Positive breath sounds bilaterally, adequate air movement,  no wheezing, rhonchi or rales. Gastrointestinal. Abdomen protuberant with no organomegaly, non tender, no rebound or guarding Skin. No rashes Musculoskeletal: no joint deformities     Data Reviewed: I have personally reviewed following labs and imaging studies  CBC: Recent Labs  Lab 06/23/19 2329  WBC 4.6  NEUTROABS 3.5  HGB 12.4  HCT 37.6  MCV 84.5  PLT 196   Basic Metabolic Panel: Recent Labs  Lab 06/23/19 2329 06/25/19 0355  NA 134* 137  K 4.0 4.4  CL 103 104  CO2 22 22  GLUCOSE 290* 343*  BUN 7 18  CREATININE 0.79 0.81  CALCIUM 8.0* 8.5*   GFR: Estimated Creatinine Clearance: 89.2 mL/min (by C-G formula based on SCr of 0.81 mg/dL). Liver Function Tests: Recent Labs  Lab 06/23/19 2329 06/25/19 0355  AST 78* 101*  ALT 72* 88*  ALKPHOS 182* 164*  BILITOT 0.6 0.2*  PROT 6.5 6.1*  ALBUMIN 2.9* 2.8*   No results for input(s): LIPASE, AMYLASE in the last 168 hours. No results for input(s): AMMONIA in the last 168 hours. Coagulation Profile: No results for input(s): INR, PROTIME in the last 168 hours. Cardiac Enzymes: No results for input(s): CKTOTAL, CKMB, CKMBINDEX, TROPONINI in the last 168 hours. BNP (last 3 results) No results for input(s): PROBNP in the last 8760 hours. HbA1C: Recent Labs    06/23/19 2329  HGBA1C 9.2*   CBG: Recent Labs  Lab 06/24/19 0040 06/24/19 0758 06/24/19 1235 06/24/19 1703 06/24/19 2107  GLUCAP 257* 221* 297* 331* 360*   Lipid Profile: No results for input(s): CHOL, HDL, LDLCALC, TRIG, CHOLHDL, LDLDIRECT in the last 72 hours. Thyroid Function Tests: No results for input(s): TSH, T4TOTAL, FREET4, T3FREE, THYROIDAB in the last 72 hours. Anemia Panel: Recent Labs    06/24/19 1011 06/25/19 0355  FERRITIN 322* 424*      Radiology Studies: I have reviewed all of the imaging during this hospital visit personally     Scheduled Meds: . vitamin C  500 mg Oral Daily  . chlorpheniramine-HYDROcodone  5 mL Oral  Q12H  . dexamethasone (DECADRON) injection  6 mg Intravenous Daily  . enoxaparin (LOVENOX) injection  60 mg Subcutaneous Q24H  . influenza vac split quadrivalent PF  0.5 mL Intramuscular Tomorrow-1000  . insulin aspart  0-20 Units Subcutaneous TID WC  . insulin aspart  5 Units Subcutaneous TID WC  . insulin detemir  20 Units Subcutaneous BID  . mouth rinse  15 mL Mouth Rinse BID  . zinc sulfate  220 mg Oral Daily   Continuous Infusions: . remdesivir 100 mg in NS 100 mL Stopped (06/24/19 1717)     LOS: 2 days        Malissa Slay Annett Gula, MD

## 2019-06-25 NOTE — Evaluation (Signed)
Physical Therapy Evaluation Patient Details Name: Stephanie [name] MRN: [id number] DOB: 1960/08/02 Today's Date: 06/25/2019   History of Present Illness  58 year old female who presented with dyspnea.  She does have significant past medical history for type 2 diabetes mellitus, brain tumor, hypertension and morbid obesity.  Patient was transferred from [facility] 06/24/19 after a diagnosis of multifocal pneumonia due to SARS COVID-19.    Clinical Impression  PTA pt living with husband in single story home with 1 step to enter. Pt mobilizes in her home environment by holding onto furniture or seated on Rollator. She requires supervision for bathing and assist with cooking. Pt is currently limited in safe mobility by oxygen desaturation on 4L O2 with mobility (see General Comments), as well as  pain with LE in elevated position L>R which contributes to generalized weakness. Pt educated on pursed lipped breathing and Incentive Spirometer to increase oxygenation. PT recommending HHPT at discharge to improve strength and mobility. PT will continue to follow acutely.     Follow Up Recommendations Home health PT;Supervision for mobility/OOB    Equipment Recommendations  3in1 (PT)       Precautions / Restrictions Precautions Precautions: Fall Precaution Comments: 1x due to tumor 2 yrs ago prior to surgery Restrictions Weight Bearing Restrictions: No      Mobility  Bed Mobility               General bed mobility comments: sitting EoB on entry  Transfers Overall transfer level: Needs assistance   Transfers: Sit to/from Stand;Stand Pivot Transfers Sit to Stand: Min guard Stand pivot transfers: Min guard       General transfer comment: min guard for sit to stand from EoB and for pivot transfer to/from [facility]  Ambulation/Gait Ambulation/Gait assistance: Min guard;Min assist Gait Distance (Feet): 16 Feet Assistive device: None Gait Pattern/deviations: Step-through  pattern;Decreased step length - right;Shuffle;Decreased step length - left Gait velocity: slowed Gait velocity interpretation: <1.31 ft/sec, indicative of household ambulator General Gait Details: minA progressing to min guard for slow, waddling gait around foot of bed to recliner     Balance Overall balance assessment: Needs assistance Sitting-balance support: Feet supported;No upper extremity supported Sitting balance-Leahy Scale: Good     Standing balance support: No upper extremity supported;During functional activity Standing balance-Leahy Scale: Fair                               Pertinent Vitals/Pain Pain Assessment: Faces Faces Pain Scale: Hurts little more Pain Location: in popleteal fossa bilateral LE L>R, worse in dependent possition  Pain Descriptors / Indicators: Sharp;Shooting Pain Intervention(s): Limited activity within patient's tolerance;Monitored during session;Repositioned    Home Living Family/patient expects to be discharged to:: Private residence Living Arrangements: Spouse/significant other Available Help at Discharge: Family Type of Home: House Home Access: Stairs to enter   Secretary/administrator of Steps: 1 Home Layout: One level Home Equipment: Environmental consultant - 4 wheels;Cane - single point      Prior Function Level of Independence: Needs assistance   Gait / Transfers Assistance Needed: uses furniture to get around in home and when feeling weak sits on Rollator and propels herself with feet  ADL's / Homemaking Assistance Needed: husband supervises shower and assist with cooking, pt stated she does the bathing and dressing he is there to help her in and out of the shower           Extremity/Trunk Assessment   Upper  Extremity Assessment Upper Extremity Assessment: Defer to OT evaluation    Lower Extremity Assessment Lower Extremity Assessment: RLE deficits/detail;LLE deficits/detail RLE Deficits / Details: Strength grossly assessed at  3+/5, ROM WFL LLE Deficits / Details: Strength grossly assessed at 3+/5, ROM WFL       Communication   Communication: Interpreter utilized(stratus interpreter Jonita Albee [phone])  Cognition Arousal/Alertness: Awake/alert Behavior During Therapy: WFL for tasks assessed/performed Overall Cognitive Status: Within Functional Limits for tasks assessed                                        General Comments General comments (skin integrity, edema, etc.): Pt on 4L O2 via [place] 92%O2 prior to ambulation 87%O2 after ambulation, pt able to recover to 92%O2 with vc for pursed lipped breathing    Exercises Other Exercises Other Exercises: Incentive spirometer x10   Assessment/Plan    PT Assessment Patient needs continued PT services  PT Problem List Decreased strength;Decreased activity tolerance;Decreased balance;Decreased mobility;Decreased safety awareness;Pain       PT Treatment Interventions DME instruction;Gait training;Functional mobility training;Therapeutic activities;Therapeutic exercise;Balance training;Cognitive remediation;Patient/family education    PT Goals (Current goals can be found in the Care Plan section)  Acute Rehab PT Goals Patient Stated Goal: go back to work PT Goal Formulation: With patient Time For Goal Achievement: 07/09/19 Potential to Achieve Goals: Good    Frequency Min 3X/week   Barriers to discharge        Co-evaluation PT/OT/SLP Co-Evaluation/Treatment: Yes Reason for Co-Treatment: For patient/therapist safety PT goals addressed during session: Mobility/safety with mobility         AM-PAC PT "6 Clicks" Mobility  Outcome Measure Help needed turning from your back to your side while in a flat bed without using bedrails?: None Help needed moving from lying on your back to sitting on the side of a flat bed without using bedrails?: A Little Help needed moving to and from a bed to a chair (including a wheelchair)?: None Help needed  standing up from a chair using your arms (e.g., wheelchair or bedside chair)?: None Help needed to walk in hospital room?: A Little Help needed climbing 3-5 steps with a railing? : A Lot 6 Click Score: 20    End of Session Equipment Utilized During Treatment: Gait belt;Oxygen Activity Tolerance: Patient tolerated treatment well Patient left: in chair;with call bell/phone within reach;with chair alarm set Nurse Communication: Mobility status PT Visit Diagnosis: Unsteadiness on feet (R26.81);Muscle weakness (generalized) (M62.81);Difficulty in walking, not elsewhere classified (R26.2);Pain Pain - Right/Left: (bilateral) Pain - part of body: Knee(popliteal fossa)    Time: 1323-1405 PT Time Calculation (min) (ACUTE ONLY): 42 min   Charges:   PT Evaluation $PT Eval Moderate Complexity: 1 Mod PT Treatments $Gait Training: 8-22 mins        Kashton Mcartor B. Migdalia Dk PT, DPT Acute Rehabilitation Services Pager [phone] Office [phone]   [place] 06/25/2019, 3:27 PM

## 2019-06-25 NOTE — Plan of Care (Signed)

## 2019-06-25 NOTE — Evaluation (Signed)
Occupational Therapy Evaluation Patient Details Name: Stephanie [name] MRN: [id number] DOB: 08/05/1960 Today's Date: 06/25/2019    History of Present Illness 58 year old female who presented with dyspnea.  She does have significant past medical history for type 2 diabetes mellitus, brain tumor, hypertension and morbid obesity.  [facility]19 after a diagnosis of multifocal pneumonia due to SARS COVID-19.     Clinical Impression   Pt admitted with above. She demonstrates the below listed deficits and will benefit from continued OT to maximize safety and independence with BADLs.  Pt presents to OT with generalized weakness, and decreased activity tolerance.  She currently requires min guard - min A for ADLs and min guard assist for functional mobility.  02 sats decreased to 87% on 4L with activity.  She reports she lives with her spouse and was mod I with ADLs PTA.       Follow Up Recommendations  No OT follow up;Supervision/Assistance - 24 hour    Equipment Recommendations  Tub/shower bench    Recommendations for Other Services       Precautions / Restrictions Precautions Precautions: Fall Precaution Comments: 1x due to tumor 2 yrs ago prior to surgery Restrictions Weight Bearing Restrictions: No      Mobility Bed Mobility               General bed mobility comments: sitting EoB on entry  Transfers Overall transfer level: Needs assistance   Transfers: Sit to/from Stand;Stand Pivot Transfers Sit to Stand: Min guard Stand pivot transfers: Min guard       General transfer comment: min guard for sit to stand from EoB and for [facility]    Balance Overall balance assessment: Needs assistance Sitting-balance support: Feet supported;No upper extremity supported Sitting balance-Leahy Scale: Good     Standing balance support: No upper extremity supported;During functional activity Standing  balance-Leahy Scale: Fair                             ADL either performed or assessed with clinical judgement   ADL Overall ADL's : Needs assistance/impaired Eating/Feeding: Independent   Grooming: Wash/dry hands;Wash/dry face;Oral care;Brushing hair;Standing   Upper Body Bathing: Set up;Sitting   Lower Body Bathing: Min guard;Sit to/from stand   Upper Body Dressing : Set up;Sitting   Lower Body Dressing: Sit to/from stand;Minimal assistance Lower Body Dressing Details (indicate cue type and reason): Pt unable to don Rt sock due to environment being different than at home.  If she were able to prop Rt foot on bed, she would be able to perform without assist  Toilet Transfer: Min guard;Ambulation;BSC   Toileting- ArchitectClothing Manipulation and Hygiene: Min guard;Sit to/from stand       Functional mobility during ADLs: Min guard       Vision         Perception     Praxis      Pertinent Vitals/Pain Pain Assessment: Faces Faces Pain Scale: Hurts little more Pain Location: in popleteal fossa bilateral LE L>R, worse when elevated  Pain Descriptors / Indicators: Sharp;Shooting Pain Intervention(s): Limited activity within patient's tolerance     Hand Dominance Right   Extremity/Trunk Assessment Upper Extremity Assessment Upper Extremity Assessment: Overall WFL for tasks assessed   Lower Extremity Assessment Lower Extremity Assessment: Defer to PT evaluation RLE Deficits / Details: Strength grossly assessed at 3+/5, ROM WFL LLE Deficits / Details: Strength grossly assessed at  3+/5, ROM WFL   Cervical / Trunk Assessment Cervical / Trunk Assessment: Normal   Communication Communication Communication: Interpreter utilized(stratus interpreter Philis Pique [phone])   Cognition Arousal/Alertness: Awake/alert Behavior During Therapy: WFL for tasks assessed/performed Overall Cognitive Status: Within Functional Limits for tasks assessed                                      General Comments  Pt on 4L O2 via [place] 92%O2 prior to ambulation 87%O2 after ambulation, pt able to recover to 92%O2 with vc for pursed lipped breathing    Exercises Exercises: Other exercises Other Exercises Other Exercises: Incentive spirometer x10 Other Exercises: Pt instructed to perform 10 reps bil. shoulder flexion every hour     Shoulder Instructions      Home Living Family/patient expects to be discharged to:: Private residence Living Arrangements: Spouse/significant other Available Help at Discharge: Family Type of Home: House Home Access: Stairs to enter Secretary/administrator of Steps: 1   Home Layout: One level     Bathroom Shower/Tub: Chief Strategy Officer: Standard Bathroom Accessibility: No   Home Equipment: Environmental consultant - 4 wheels;Cane - single point          Prior Functioning/Environment Level of Independence: Needs assistance  Gait / Transfers Assistance Needed: uses furniture to get around in home and when feeling weak sits on Rollator and propels herself with feet ADL's / Homemaking Assistance Needed: husband supervises shower and assist with cooking, pt stated she does the bathing and dressing he is there to help her in and out of the shower            OT Problem List: Decreased strength;Decreased activity tolerance;Impaired balance (sitting and/or standing);Cardiopulmonary status limiting activity;Obesity;Pain      OT Treatment/Interventions: Self-care/ADL training;DME and/or AE instruction;Therapeutic activities;Patient/family education;Balance training;Therapeutic exercise;Energy conservation    OT Goals(Current goals can be found in the care plan section) Acute Rehab OT Goals Patient Stated Goal: go back to work OT Goal Formulation: With patient Time For Goal Achievement: 07/09/19 Potential to Achieve Goals: Good ADL Goals Pt Will Perform Grooming: with modified independence;standing Pt Will Perform Upper Body  Bathing: with modified independence;sitting Pt Will Perform Lower Body Bathing: with modified independence;sit to/from stand Pt Will Perform Upper Body Dressing: with modified independence;sitting Pt Will Perform Lower Body Dressing: with modified independence;sit to/from stand Pt Will Transfer to Toilet: with modified independence;ambulating;regular height toilet;bedside commode;grab bars Pt Will Perform Toileting - Clothing Manipulation and hygiene: with modified independence;sit to/from stand  OT Frequency: Min 1X/week   Barriers to D/C:            Co-evaluation PT/OT/SLP Co-Evaluation/Treatment: Yes Reason for Co-Treatment: For patient/therapist safety PT goals addressed during session: Mobility/safety with mobility OT goals addressed during session: ADL's and self-care      AM-PAC OT "6 Clicks" Daily Activity     Outcome Measure Help from another person eating meals?: None Help from another person taking care of personal grooming?: A Little Help from another person toileting, which includes using toliet, bedpan, or urinal?: A Little Help from another person bathing (including washing, rinsing, drying)?: A Little Help from another person to put on and taking off regular upper body clothing?: A Little Help from another person to put on and taking off regular lower body clothing?: A Little 6 Click Score: 19   End of Session Equipment Utilized During Treatment: Gait belt;Oxygen Nurse  Communication: Mobility status  Activity Tolerance: Patient tolerated treatment well Patient left: in chair;with call bell/phone within reach;with chair alarm set  OT Visit Diagnosis: Unsteadiness on feet (R26.81)                Time: 1700-1749 OT Time Calculation (min): 42 min Charges:  OT General Charges $OT Visit: 1 Visit OT Evaluation $OT Eval Moderate Complexity: 1 Mod  Nilsa Nutting., OTR/L Acute Rehabilitation Services Pager [phone] Office [phone]   Lucille Passy  M 06/25/2019, 3:43 PM

## 2019-06-26 LAB — COMPREHENSIVE METABOLIC PANEL
ALT: 101 U/L — ABNORMAL HIGH (ref 0–44)
AST: 83 U/L — ABNORMAL HIGH (ref 15–41)
Albumin: 2.7 g/dL — ABNORMAL LOW (ref 3.5–5.0)
Alkaline Phosphatase: 139 U/L — ABNORMAL HIGH (ref 38–126)
Anion gap: 11 (ref 5–15)
BUN: 23 mg/dL — ABNORMAL HIGH (ref 6–20)
CO2: 23 mmol/L (ref 22–32)
Calcium: 8.6 mg/dL — ABNORMAL LOW (ref 8.9–10.3)
Chloride: 106 mmol/L (ref 98–111)
Creatinine, Ser: 0.75 mg/dL (ref 0.44–1.00)
GFR calc Af Amer: >60 mL/min (ref >60)
GFR calc non Af Amer: >60 mL/min (ref >60)
Glucose, Bld: 327 mg/dL — ABNORMAL HIGH (ref 70–99)
Potassium: 4.4 mmol/L (ref 3.5–5.1)
Sodium: 140 mmol/L (ref 135–145)
Total Bilirubin: <0.1 mg/dL — ABNORMAL LOW (ref 0.3–1.2)
Total Protein: 5.9 g/dL — ABNORMAL LOW (ref 6.5–8.1)

## 2019-06-26 LAB — GLUCOSE, CAPILLARY
Glucose-Capillary: 268 mg/dL — ABNORMAL HIGH (ref 70–99)
Glucose-Capillary: 351 mg/dL — ABNORMAL HIGH (ref 70–99)
Glucose-Capillary: 417 mg/dL — ABNORMAL HIGH (ref 70–99)
Glucose-Capillary: 439 mg/dL — ABNORMAL HIGH (ref 70–99)
Glucose-Capillary: 418 mg/dL — ABNORMAL HIGH (ref 70–99)

## 2019-06-26 LAB — FERRITIN: Ferritin: 305 ng/mL (ref 11–307)

## 2019-06-26 LAB — D-DIMER, QUANTITATIVE: D-Dimer, Quant: 0.49 ug/mL-FEU (ref 0.00–0.50)

## 2019-06-26 LAB — C-REACTIVE PROTEIN: CRP: 4.8 mg/dL — ABNORMAL HIGH (ref <1.0)

## 2019-06-26 MED ORDER — INSULIN ASPART 100 UNIT/ML ~~LOC~~ SOLN
12.0000 [IU] | Freq: Once | SUBCUTANEOUS | Status: AC
  Administered 2019-06-26: 22:00:00 12 [IU] via SUBCUTANEOUS

## 2019-06-26 MED ORDER — INSULIN DETEMIR 100 UNIT/ML ~~LOC~~ SOLN
40.0000 [IU] | Freq: Two times a day (BID) | SUBCUTANEOUS | Status: DC
  Administered 2019-06-26 – 2019-06-29 (×6): 40 [IU] via SUBCUTANEOUS
  Filled 2019-06-26 (×8): qty 0.4

## 2019-06-26 NOTE — Progress Notes (Signed)
PROGRESS NOTE    Stephanie [name]  QIW:[id number] DOB: Apr 29, 1961 DOA: 06/23/2019 PCP: Patient, No Pcp Per    Brief Narrative:  58 year old female who presented with dyspnea. She does have significant past medical history for type 2 diabetes mellitus, hypertension and morbid obesity. Patient was transferred from [facility] after a diagnosis of multifocal pneumonia due to SARS COVID-19.   She tested positive for SARS COVID-19 December 19, her symptoms consistent with headache, diarrhea, loss of sense of smell and taste, fever and progressive/worsening dyspnea. December 22 she was evaluated at [facility], her oximetry was 80%, her temperature was 100 F, heart rate 101, blood pressure 127/60.  She was transferred to [facility] on December 23 for further management at the time of her transfer her blood pressure was 134/67, pulse rate 82, respirate 22-29, oxygen saturation 90% on supplemental oxygen, her lungs were clear to auscultation bilaterally, heart S1-S2 present rhythmic, abdomen was soft, no lower extremity edema.  Chest film personally reviewed noted bilateral interstitial infiltrates, right upper and lower lobe and left lower lobe.  Patient has been responding well to medical therapy with Remdesivir and Dexamethasone.    Assessment & Plan:   Principal Problem:   Pneumonia due to COVID-19 virus Active Problems:   Transaminitis   Type 2 diabetes mellitus without complication, with long-term current use of insulin (HCC)   1.Acute hypoxic respiratory failure due to SARS COVID-19 viral pneumonia.  RR: 20  Pulse oxymetry: 93%  Fi02: 3 L/ min per [place]   COVID-19 Labs  Recent Labs    06/24/19 1011 06/25/19 0355 06/26/19 0309  DDIMER 0.64* 0.50 0.49  FERRITIN 322* 424* 305  CRP 16.8* 9.6* 4.8*    No results found for: SARSCOV2NAA   Continue Inflammatory markers trending down.   Continue medical therapy with Remdesivir #4/5 (AST 83, ALT  101), continue with systemic corticosteroids with dexemethasone 6 mg IV q 24 H. Continue with antitussive agents and airway clearing techniques with flutter valve and incentive spirometer. Continue with vitamin c and zinc.   Patient will need home health PT at discharge, and will also need a ambulatory oxymetry on room air before discharge.   2. Uncontrolled T2DM (Hgb A1c 9,2), with steroid induced hyperglycemia. Fasting glucose this am at 327, on basal insulin to 30 units bid, insulin sliding scale for glucose cover and monitoring and 5 units for pre-meal coverage. Will increase to 40 bid per her home regimen. At discharge will resume oral hypoglycemic agents metformin and januvia.    3. Morbid obesity. Positive risk factor for severe COVID 19.  Will need life style modifications.   DVT prophylaxis:enoxaparin Code Status:full Family Communication:no family at the bedside Disposition Plan/ discharge barriers:Possible dc in am.     Subjective: Patient continue to improve but not yet back to baseline, energy and dyspnea are improving, no chest pain, no nausea or vomiting, has been out of bed with positive dyspnea on exertion.   Objective: Vitals:   06/25/19 1800 06/25/19 2150 06/26/19 0314 06/26/19 0559  BP:  105/72  (!) 156/69  Pulse:  68 (!) 57 62  Resp:  20  20  Temp:  98.5 F (36.9 C)  98.7 F (37.1 C)  TempSrc:  Oral  Oral  SpO2: 93% 96%  93%  Weight:   119 kg   Height:        Intake/Output Summary (Last 24 hours) at 06/26/2019 0930 Last data filed at 06/25/2019 1820 Gross per 24 hour  Intake  480 ml  Output --  Net 480 ml   Filed Weights   06/23/19 2306 06/24/19 0230 06/26/19 0314  Weight: 123.2 kg 123 kg 119 kg    Examination:   General: Not in pain or dyspnea, deconditioned  Neurology: Awake and alert, non focal  E ENT: mild pallor, no icterus, oral mucosa moist Cardiovascular: No JVD. S1-S2 present, No lower extremity edema. Pulmonary:  positive breath sounds bilaterally. Gastrointestinal. Abdomen flat, no organomegaly, non tender, no rebound or guarding Skin. No rashes Musculoskeletal: no joint deformities     Data Reviewed: I have personally reviewed following labs and imaging studies  CBC: Recent Labs  Lab 06/23/19 2329  WBC 4.6  NEUTROABS 3.5  HGB 12.4  HCT 37.6  MCV 84.5  PLT 500   Basic Metabolic Panel: Recent Labs  Lab 06/23/19 2329 06/25/19 0355 06/26/19 0309  NA 134* 137 140  K 4.0 4.4 4.4  CL 103 104 106  CO2 22 22 23   GLUCOSE 290* 343* 327*  BUN 7 18 23*  CREATININE 0.79 0.81 0.75  CALCIUM 8.0* 8.5* 8.6*   GFR: Estimated Creatinine Clearance: 88.4 mL/min (by C-G formula based on SCr of 0.75 mg/dL). Liver Function Tests: Recent Labs  Lab 06/23/19 2329 06/25/19 0355 06/26/19 0309  AST 78* 101* 83*  ALT 72* 88* 101*  ALKPHOS 182* 164* 139*  BILITOT 0.6 0.2* <0.1*  PROT 6.5 6.1* 5.9*  ALBUMIN 2.9* 2.8* 2.7*   No results for input(s): LIPASE, AMYLASE in the last 168 hours. No results for input(s): AMMONIA in the last 168 hours. Coagulation Profile: No results for input(s): INR, PROTIME in the last 168 hours. Cardiac Enzymes: No results for input(s): CKTOTAL, CKMB, CKMBINDEX, TROPONINI in the last 168 hours. BNP (last 3 results) No results for input(s): PROBNP in the last 8760 hours. HbA1C: Recent Labs    06/23/19 2329  HGBA1C 9.2*   CBG: Recent Labs  Lab 06/25/19 0810 06/25/19 1200 06/25/19 1703 06/25/19 2152 06/26/19 0803  GLUCAP 289* 351* 314* 340* 268*   Lipid Profile: No results for input(s): CHOL, HDL, LDLCALC, TRIG, CHOLHDL, LDLDIRECT in the last 72 hours. Thyroid Function Tests: No results for input(s): TSH, T4TOTAL, FREET4, T3FREE, THYROIDAB in the last 72 hours. Anemia Panel: Recent Labs    06/25/19 0355 06/26/19 0309  FERRITIN 424* 305      Radiology Studies: I have reviewed all of the imaging during this hospital visit  personally     Scheduled Meds: . vitamin C  500 mg Oral Daily  . chlorpheniramine-HYDROcodone  5 mL Oral Q12H  . dexamethasone (DECADRON) injection  6 mg Intravenous Daily  . enoxaparin (LOVENOX) injection  60 mg Subcutaneous Q24H  . influenza vac split quadrivalent PF  0.5 mL Intramuscular Tomorrow-1000  . insulin aspart  0-20 Units Subcutaneous TID WC  . insulin aspart  5 Units Subcutaneous TID WC  . insulin detemir  30 Units Subcutaneous BID  . mouth rinse  15 mL Mouth Rinse BID  . zinc sulfate  220 mg Oral Daily   Continuous Infusions: . remdesivir 100 mg in NS 100 mL 100 mg (06/26/19 0919)     LOS: 3 days        Margy Sumler Gerome Apley, MD

## 2019-06-27 DIAGNOSIS — R739 Hyperglycemia, unspecified: Secondary | ICD-10-CM

## 2019-06-27 LAB — COMPREHENSIVE METABOLIC PANEL
ALT: 112 U/L — ABNORMAL HIGH (ref 0–44)
AST: 56 U/L — ABNORMAL HIGH (ref 15–41)
Albumin: 2.6 g/dL — ABNORMAL LOW (ref 3.5–5.0)
Alkaline Phosphatase: 123 U/L (ref 38–126)
Anion gap: 7 (ref 5–15)
BUN: 21 mg/dL — ABNORMAL HIGH (ref 6–20)
CO2: 25 mmol/L (ref 22–32)
Calcium: 8.2 mg/dL — ABNORMAL LOW (ref 8.9–10.3)
Chloride: 109 mmol/L (ref 98–111)
Creatinine, Ser: 0.75 mg/dL (ref 0.44–1.00)
GFR calc Af Amer: >60 mL/min (ref >60)
GFR calc non Af Amer: >60 mL/min (ref >60)
Glucose, Bld: 322 mg/dL — ABNORMAL HIGH (ref 70–99)
Potassium: 4.3 mmol/L (ref 3.5–5.1)
Sodium: 141 mmol/L (ref 135–145)
Total Bilirubin: 0.3 mg/dL (ref 0.3–1.2)
Total Protein: 5.8 g/dL — ABNORMAL LOW (ref 6.5–8.1)

## 2019-06-27 LAB — C-REACTIVE PROTEIN: CRP: 2.1 mg/dL — ABNORMAL HIGH (ref <1.0)

## 2019-06-27 LAB — D-DIMER, QUANTITATIVE: D-Dimer, Quant: 0.49 ug/mL-FEU (ref 0.00–0.50)

## 2019-06-27 LAB — FERRITIN: Ferritin: 205 ng/mL (ref 11–307)

## 2019-06-27 LAB — GLUCOSE, CAPILLARY
Glucose-Capillary: 287 mg/dL — ABNORMAL HIGH (ref 70–99)
Glucose-Capillary: 314 mg/dL — ABNORMAL HIGH (ref 70–99)
Glucose-Capillary: 347 mg/dL — ABNORMAL HIGH (ref 70–99)
Glucose-Capillary: 311 mg/dL — ABNORMAL HIGH (ref 70–99)

## 2019-06-27 MED ORDER — INSULIN ASPART 100 UNIT/ML ~~LOC~~ SOLN
10.0000 [IU] | Freq: Three times a day (TID) | SUBCUTANEOUS | Status: DC
  Administered 2019-06-27 – 2019-07-01 (×12): 10 [IU] via SUBCUTANEOUS

## 2019-06-27 NOTE — Progress Notes (Signed)
PROGRESS NOTE    Stephanie [name]  PNT:[id number] DOB: 08-18-1960 DOA: 06/23/2019 PCP: Patient, No Pcp Per    Brief Narrative:  58 year old female who presented with dyspnea. She does have significant past medical history for type 2 diabetes mellitus, hypertension and morbid obesity. Patient was transferred from [facility] after a diagnosis of multifocal pneumonia due to SARS COVID-19.  She tested positive for SARS COVID-19 December 19, her symptoms consistent with headache, diarrhea, loss of sense of smell and taste, fever and progressive/worsening dyspnea. December 22 she was evaluated at [facility], her oximetry was 80%, her temperature was 100 F, heart rate 101, blood pressure 127/60. She was transferred to [facility] on December 23 for further management. At the time of her transfer her blood pressure was 134/67, pulse rate 82, respiratory rate 22-29, oxygen saturation 90% on supplemental oxygen, her lungswere clear to auscultation bilaterally, heart S1-S2 present rhythmic, abdomen was soft, no lower extremity edema.  Chest film personally reviewed noted bilateral interstitial infiltrates, right upper and lower lobe and left lower lobe.  Patient responding well to medical therapy with Remdesivir and Dexamethasone, but continue to have increase oxygen requirements, fatigue and cough.    Assessment & Plan:   Principal Problem:   Pneumonia due to COVID-19 virus Active Problems:   Type 2 diabetes mellitus without complication, with long-term current use of insulin (HCC)   Morbid obesity ([place])   1.  Acute hypoxic respiratory failure due to SARS COVID-19 viral pneumonia. Patient was admitted to the medical ward, she received supplemental oxygen per nasal cannula, medical therapy with remdesivir #5/5, will continue with dexamethasone 6 mg IV q 24 while hospitalized.  She received antitussive agents and airway clearing techniques with flutter valve incentive  spirometer.  Her symptoms and inflammatory markers improved.  Her oximetry is 93% 4 L/ min.  Patient will have amatory oximetry on room air before discharge, to assess the need of home oxygen.  Mild elevation of AST and ALT related to acute viral process.   COVID-19 Labs  Recent Labs    06/25/19 0355 06/26/19 0309 06/27/19 0500  DDIMER 0.50 0.49 0.49  FERRITIN 424* 305 205  CRP 9.6* 4.8* 2.1*    No results found for: SARSCOV2NAA    2.  Uncontrolled type 2 diabetes mellitus (hemoglobin G8Q 9.2), complicated with steroid-induced hyperglycemia.  Patient receiving insulin therapy: Basal, premeal and sliding scale.    Glucose continue to be elevated, fasting 322, used 60 units from sliding scale. Will increase meal coverage to 10 units and continue basal with 40 units bid of detemir.   3.  Morbid obesity.  Calculated BMI 51.1.  Patient will need further outpatient lifestyle modifications.  Patient was seen by physical therapy, wth recommendations for home health services.   DVT prophylaxis: enoxaparin   Code Status:  full Family Communication: no family at the bedside  Disposition Plan/ discharge barriers: plan for possible dc 06/28/19 home with home health services, and likely home 02.    Subjective: Patient feeling better but not yet back to her baseline, continue to have significant fatigue and dyspnea on exertion. Positive cough and fatigue.   Objective: Vitals:   06/26/19 1428 06/26/19 1600 06/27/19 0429 06/27/19 0551  BP: (!) 114/43 122/62  (!) 116/55  Pulse: 66 66  (!) 58  Resp: 20   18  Temp: 98.1 F (36.7 C)   98.4 F (36.9 C)  TempSrc: Oral   Oral  SpO2: 97%   93%  Weight:  118.8 kg   Height:        Intake/Output Summary (Last 24 hours) at 06/27/2019 1432 Last data filed at 06/27/2019 0948 Gross per 24 hour  Intake 400 ml  Output 600 ml  Net -200 ml   Filed Weights   06/24/19 0230 06/26/19 0314 06/27/19 0429  Weight: 123 kg 119 kg 118.8 kg     Examination:   General: positive dyspnea and deconditioned  Neurology: Awake and alert, non focal  E ENT: mild pallor, no icterus, oral mucosa moist Cardiovascular: No JVD. S1-S2 present, rhythmic. No lower extremity edema. Pulmonary: positive breath sounds bilaterally. Gastrointestinal. Abdomen with, no organomegaly, non tender, no rebound or guarding Skin. No rashes Musculoskeletal: no joint deformities     Data Reviewed: I have personally reviewed following labs and imaging studies  CBC: Recent Labs  Lab 06/23/19 2329  WBC 4.6  NEUTROABS 3.5  HGB 12.4  HCT 37.6  MCV 84.5  PLT 196   Basic Metabolic Panel: Recent Labs  Lab 06/23/19 2329 06/25/19 0355 06/26/19 0309 06/27/19 0500  NA 134* 137 140 141  K 4.0 4.4 4.4 4.3  CL 103 104 106 109  CO2 22 22 23 25   GLUCOSE 290* 343* 327* 322*  BUN 7 18 23* 21*  CREATININE 0.79 0.81 0.75 0.75  CALCIUM 8.0* 8.5* 8.6* 8.2*   GFR: Estimated Creatinine Clearance: 88.3 mL/min (by C-G formula based on SCr of 0.75 mg/dL). Liver Function Tests: Recent Labs  Lab 06/23/19 2329 06/25/19 0355 06/26/19 0309 06/27/19 0500  AST 78* 101* 83* 56*  ALT 72* 88* 101* 112*  ALKPHOS 182* 164* 139* 123  BILITOT 0.6 0.2* <0.1* 0.3  PROT 6.5 6.1* 5.9* 5.8*  ALBUMIN 2.9* 2.8* 2.7* 2.6*   No results for input(s): LIPASE, AMYLASE in the last 168 hours. No results for input(s): AMMONIA in the last 168 hours. Coagulation Profile: No results for input(s): INR, PROTIME in the last 168 hours. Cardiac Enzymes: No results for input(s): CKTOTAL, CKMB, CKMBINDEX, TROPONINI in the last 168 hours. BNP (last 3 results) No results for input(s): PROBNP in the last 8760 hours. HbA1C: No results for input(s): HGBA1C in the last 72 hours. CBG: Recent Labs  Lab 06/26/19 1652 06/26/19 1654 06/26/19 2141 06/27/19 0831 06/27/19 1202  GLUCAP 417* 439* 418* 287* 314*   Lipid Profile: No results for input(s): CHOL, HDL, LDLCALC, TRIG, CHOLHDL,  LDLDIRECT in the last 72 hours. Thyroid Function Tests: No results for input(s): TSH, T4TOTAL, FREET4, T3FREE, THYROIDAB in the last 72 hours. Anemia Panel: Recent Labs    06/26/19 0309 06/27/19 0500  FERRITIN 305 205      Radiology Studies: I have reviewed all of the imaging during this hospital visit personally     Scheduled Meds: . vitamin C  500 mg Oral Daily  . chlorpheniramine-HYDROcodone  5 mL Oral Q12H  . dexamethasone (DECADRON) injection  6 mg Intravenous Daily  . enoxaparin (LOVENOX) injection  60 mg Subcutaneous Q24H  . influenza vac split quadrivalent PF  0.5 mL Intramuscular Tomorrow-1000  . insulin aspart  0-20 Units Subcutaneous TID WC  . insulin aspart  5 Units Subcutaneous TID WC  . insulin detemir  40 Units Subcutaneous BID  . mouth rinse  15 mL Mouth Rinse BID  . zinc sulfate  220 mg Oral Daily   Continuous Infusions:   LOS: 4 days        Cobain Morici 06/29/19, MD

## 2019-06-28 ENCOUNTER — Inpatient Hospital Stay (HOSPITAL_COMMUNITY): Payer: HRSA Program

## 2019-06-28 DIAGNOSIS — R0600 Dyspnea, unspecified: Secondary | ICD-10-CM

## 2019-06-28 LAB — GLUCOSE, CAPILLARY
Glucose-Capillary: 198 mg/dL — ABNORMAL HIGH (ref 70–99)
Glucose-Capillary: 262 mg/dL — ABNORMAL HIGH (ref 70–99)
Glucose-Capillary: 410 mg/dL — ABNORMAL HIGH (ref 70–99)
Glucose-Capillary: 357 mg/dL — ABNORMAL HIGH (ref 70–99)

## 2019-06-28 LAB — FERRITIN: Ferritin: 178 ng/mL (ref 11–307)

## 2019-06-28 LAB — C-REACTIVE PROTEIN: CRP: 1.5 mg/dL — ABNORMAL HIGH (ref <1.0)

## 2019-06-28 LAB — D-DIMER, QUANTITATIVE: D-Dimer, Quant: 0.83 ug/mL-FEU — ABNORMAL HIGH (ref 0.00–0.50)

## 2019-06-28 MED ORDER — FUROSEMIDE 10 MG/ML IJ SOLN
40.0000 mg | Freq: Two times a day (BID) | INTRAMUSCULAR | Status: DC
  Administered 2019-06-28 – 2019-06-30 (×4): 40 mg via INTRAVENOUS
  Filled 2019-06-28 (×4): qty 4

## 2019-06-28 NOTE — Progress Notes (Signed)
Patient 87% on 4L [place] ambulating in hallway, very short of breath. She stated she did not feel comfortable discharging today and requesting one more evening.

## 2019-06-28 NOTE — Progress Notes (Signed)
Patient is resting comfortably. No complaints. Alert/orientation x4. Up ad lib in room. On 4L O2 sat 94% on 4L at rest. No s/s distress noted.

## 2019-06-28 NOTE — Plan of Care (Signed)

## 2019-06-28 NOTE — Progress Notes (Signed)
[facility]  PROGRESS NOTE  Stephanie [name] VXB:[id number] DOB: [birth year] DOA: 06/23/2019 PCP: Patient, No Pcp Per   Brief HPI:   58 year old female with a history of type 2 diabetes mellitus, hypertension, morbid obesity who was transferred from [facility] diagnosis of multifocal pneumonia due to SARS COVID-19.  She had tested positive for SARS COVID-19 on December 19, her symptom consisted of headache, diarrhea, loss of sense of smell and taste, fever, progressive/worsening dyspnea.  She was transferred to [facility] on December 23 for further management.  Chest x-ray showed bilateral interstitial infiltrates.  She was started on remdesivir and Decadron.    Subjective   Patient seen and examined, continues to have dyspnea, she has completed remdesivir and Decadron.  Inflammatory markers have significantly improved.  Chest x-ray obtained today also shows improvement.   Assessment/Plan:     1. Acute hypoxic respiratory failure-due to SARS COVID-19 pneumonia.  Patient was started on remdesivir and Decadron and has completed 5 days of remdesivir in the hospital.  Her inflammatory markers show significant improvement.  Patient is still requiring 4 L/min of oxygen, chest x-ray shows improvement but still has infiltrates bilaterally.  At this time her respiratory failure is likely from underlying CHF. Will her on Lasix 40 mg IV every 12 hours.  Will assess patient's response to diuresis.  Obtain echocardiogram.  She is +3 L fluid overload.  2. Diabetes mellitus type 2, uncontrolled due to steroid induced hyperglycemia-continue sliding scale insulin with NovoLog, Levemir 40 units subcu twice a day, meal coverage 10 units 3 times a day      COVID-19 Labs  Recent Labs    06/26/19 0309 06/27/19 0500 06/28/19 0837  DDIMER 0.49 0.49 0.83*  FERRITIN 305 205 178  CRP 4.8* 2.1* 1.5*    No results found for: SARSCOV2NAA  CBG: Recent Labs  Lab  06/27/19 1202 06/27/19 1648 06/27/19 2130 06/28/19 0813 06/28/19 1152  GLUCAP 314* 347* 311* 198* 262*    CBC: Recent Labs  Lab 06/23/19 2329  WBC 4.6  NEUTROABS 3.5  HGB 12.4  HCT 37.6  MCV 84.5  PLT 196    Basic Metabolic Panel: Recent Labs  Lab 06/23/19 2329 06/25/19 0355 06/26/19 0309 06/27/19 0500  NA 134* 137 140 141  K 4.0 4.4 4.4 4.3  CL 103 104 106 109  CO2 22 22 23 25   GLUCOSE 290* 343* 327* 322*  BUN 7 18 23* 21*  CREATININE 0.79 0.81 0.75 0.75  CALCIUM 8.0* 8.5* 8.6* 8.2*       DVT prophylaxis: Lovenox  Code Status: Full code  Family Communication: No family at bedside  Disposition Plan: likely home when medically ready for discharge        BMI  Estimated body mass index is 51.15 kg/m as calculated from the following:   Height as of this encounter: 5' (1.524 m).   Weight as of this encounter: 118.8 kg.  Scheduled medications:  . vitamin C  500 mg Oral Daily  . chlorpheniramine-HYDROcodone  5 mL Oral Q12H  . dexamethasone (DECADRON) injection  6 mg Intravenous Daily  . enoxaparin (LOVENOX) injection  60 mg Subcutaneous Q24H  . furosemide  40 mg Intravenous Q12H  . influenza vac split quadrivalent PF  0.5 mL Intramuscular Tomorrow-1000  . insulin aspart  0-20 Units Subcutaneous TID WC  . insulin aspart  10 Units Subcutaneous TID WC  . insulin detemir  40 Units Subcutaneous BID  . mouth rinse  15 mL  Mouth Rinse BID  . zinc sulfate  220 mg Oral Daily     Antibiotics:   Anti-infectives (From admission, onward)   Start     Dose/Rate Route Frequency Ordered Stop   06/24/19 1000  remdesivir 100 mg in sodium chloride 0.9 % 100 mL IVPB     100 mg 200 mL/hr over 30 Minutes Intravenous Daily 06/24/19 0042 06/28/19 0743       Objective   Vitals:   06/28/19 1200 06/28/19 1212 06/28/19 1330 06/28/19 1400  BP: (!) 140/56 140/65 (!) 140/56   Pulse: 75 71 84   Resp: 18  (!) 24 18  Temp: 98.5 F (36.9 C)     TempSrc: Oral      SpO2: 94%     Weight:      Height:        Intake/Output Summary (Last 24 hours) at 06/28/2019 1545 Last data filed at 06/28/2019 1400 Gross per 24 hour  Intake 797 ml  Output 650 ml  Net 147 ml   Filed Weights   06/24/19 0230 06/26/19 0314 06/27/19 0429  Weight: 123 kg 119 kg 118.8 kg     Physical Examination:    General: Appears in no acute distress  Cardiovascular: S1-S2, regular, no murmur auscultated  Respiratory: Decreased breath sounds at lung bases  Abdomen: Abdomen is soft, nontender, no organomegaly  Extremities: No edema in the lower extremities  Neurologic: Cranial nerves II through XII grossly intact, no focal deficit noted     Data Reviewed: I have personally reviewed following labs and imaging studies   No results found for this or any previous visit (from the past 240 hour(s)).   Liver Function Tests: Recent Labs  Lab 06/23/19 2329 06/25/19 0355 06/26/19 0309 06/27/19 0500  AST 78* 101* 83* 56*  ALT 72* 88* 101* 112*  ALKPHOS 182* 164* 139* 123  BILITOT 0.6 0.2* <0.1* 0.3  PROT 6.5 6.1* 5.9* 5.8*  ALBUMIN 2.9* 2.8* 2.7* 2.6*      Studies: CXR Portable same day  Result Date: 06/28/2019 CLINICAL DATA:  Respiratory failure.  COVID-19. EXAM: PORTABLE CHEST 1 VIEW COMPARISON:  06/24/2019 FINDINGS: 1058 hours. Low lung volumes. Slight interval decrease in patchy ill-defined bilateral airspace disease right slightly more than left. No pleural effusion. Cardiopericardial silhouette is at upper limits of normal for size. The visualized bony structures of the thorax are intact. IMPRESSION: Asymmetric ill-defined patchy airspace disease in the mid and lower lungs appears slightly improved in the interval. Electronically Signed   By: Misty Stanley M.D.   On: 06/28/2019 11:23     Admission status: Inpatient: Based on patients clinical presentation and evaluation of above clinical data, I have made determination that patient meets Inpatient  criteria at this time.   Oswald Hillock   [facility] If 7PM-7AM, please contact night-coverage at [email or web address], Office  [phone]  password [email or web address]  06/28/2019, 3:45 PM  LOS: 5 days

## 2019-06-29 ENCOUNTER — Inpatient Hospital Stay (HOSPITAL_COMMUNITY): Payer: HRSA Program

## 2019-06-29 DIAGNOSIS — I509 Heart failure, unspecified: Secondary | ICD-10-CM

## 2019-06-29 LAB — BASIC METABOLIC PANEL
Anion gap: 11 (ref 5–15)
BUN: 23 mg/dL — ABNORMAL HIGH (ref 6–20)
CO2: 25 mmol/L (ref 22–32)
Calcium: 8.5 mg/dL — ABNORMAL LOW (ref 8.9–10.3)
Chloride: 100 mmol/L (ref 98–111)
Creatinine, Ser: 0.77 mg/dL (ref 0.44–1.00)
GFR calc Af Amer: >60 mL/min (ref >60)
GFR calc non Af Amer: >60 mL/min (ref >60)
Glucose, Bld: 315 mg/dL — ABNORMAL HIGH (ref 70–99)
Potassium: 3.6 mmol/L (ref 3.5–5.1)
Sodium: 136 mmol/L (ref 135–145)

## 2019-06-29 LAB — ECHOCARDIOGRAM LIMITED
Height: 60 in
Weight: 4190.5 oz

## 2019-06-29 LAB — GLUCOSE, CAPILLARY
Glucose-Capillary: 213 mg/dL — ABNORMAL HIGH (ref 70–99)
Glucose-Capillary: 257 mg/dL — ABNORMAL HIGH (ref 70–99)
Glucose-Capillary: 405 mg/dL — ABNORMAL HIGH (ref 70–99)
Glucose-Capillary: 272 mg/dL — ABNORMAL HIGH (ref 70–99)

## 2019-06-29 MED ORDER — POTASSIUM CHLORIDE CRYS ER 20 MEQ PO TBCR
20.0000 meq | EXTENDED_RELEASE_TABLET | Freq: Every day | ORAL | Status: DC
  Administered 2019-06-29 – 2019-07-02 (×4): 20 meq via ORAL
  Filled 2019-06-29 (×4): qty 1

## 2019-06-29 MED ORDER — INSULIN DETEMIR 100 UNIT/ML ~~LOC~~ SOLN
50.0000 [IU] | Freq: Two times a day (BID) | SUBCUTANEOUS | Status: DC
  Administered 2019-06-29 – 2019-07-01 (×4): 50 [IU] via SUBCUTANEOUS
  Filled 2019-06-29 (×5): qty 0.5

## 2019-06-29 NOTE — Progress Notes (Signed)
[facility]  PROGRESS NOTE  Stephanie [name] UDJ:[id number] DOB: June 23, 1961 DOA: 06/23/2019 PCP: Patient, No Pcp Per   Brief HPI:   58 year old female with a history of type 2 diabetes mellitus, hypertension, morbid obesity who was transferred from [facility] diagnosis of multifocal pneumonia due to SARS COVID-19.  She had tested positive for SARS COVID-19 on December 19, her symptom consisted of headache, diarrhea, loss of sense of smell and taste, fever, progressive/worsening dyspnea.  She was transferred to [facility] on December 23 for further management.  Chest x-ray showed bilateral interstitial infiltrates.  She was started on remdesivir and Decadron.    Subjective   Patient seen and examined, excellent diuresis with Lasix.  Echocardiogram is ordered and is currently pending.   Assessment/Plan:     1. Acute hypoxic respiratory failure-due to SARS COVID-19 pneumonia.  Patient was started on remdesivir and Decadron and has completed 5 days of remdesivir in the hospital.  Her inflammatory markers show significant improvement.  Patient is still requiring 4 L/min of oxygen, chest x-ray shows improvement but still has infiltrates bilaterally.  At this time her respiratory failure is likely from underlying CHF.  Patient was started on Lasix 40 mg IV every 12 hours, with good diuresis.  Echocardiogram is currently pending.  We will continue with diuresis, monitor intake and output.  Try to wean off oxygen as tolerated.    2. Diabetes mellitus type 2, uncontrolled due to steroid induced hyperglycemia-continue sliding scale insulin with NovoLog, will increase Levemir to 50 units subcu twice daily, NovoLog 10 units subcu 3 times daily meal coverage.   SpO2: 94 % O2 Flow Rate (L/min): 4 L/min    COVID-19 Labs  Recent Labs    06/27/19 0500 06/28/19 0837  DDIMER 0.49 0.83*  FERRITIN 205 178  CRP 2.1* 1.5*    No results found for:  SARSCOV2NAA  CBG: Recent Labs  Lab 06/28/19 1152 06/28/19 1610 06/28/19 2042 06/29/19 0756 06/29/19 1147  GLUCAP 262* 410* 357* 213* 257*    CBC: Recent Labs  Lab 06/23/19 2329  WBC 4.6  NEUTROABS 3.5  HGB 12.4  HCT 37.6  MCV 84.5  PLT 196    Basic Metabolic Panel: Recent Labs  Lab 06/23/19 2329 06/25/19 0355 06/26/19 0309 06/27/19 0500 06/29/19 0930  NA 134* 137 140 141 136  K 4.0 4.4 4.4 4.3 3.6  CL 103 104 106 109 100  CO2 22 22 23 25 25   GLUCOSE 290* 343* 327* 322* 315*  BUN 7 18 23* 21* 23*  CREATININE 0.79 0.81 0.75 0.75 0.77  CALCIUM 8.0* 8.5* 8.6* 8.2* 8.5*       DVT prophylaxis: Lovenox  Code Status: Full code  Family Communication: No family at bedside  Disposition Plan: likely home when medically ready for discharge        BMI  Estimated body mass index is 51.15 kg/m as calculated from the following:   Height as of this encounter: 5' (1.524 m).   Weight as of this encounter: 118.8 kg.  Scheduled medications:  . vitamin C  500 mg Oral Daily  . chlorpheniramine-HYDROcodone  5 mL Oral Q12H  . dexamethasone (DECADRON) injection  6 mg Intravenous Daily  . enoxaparin (LOVENOX) injection  60 mg Subcutaneous Q24H  . furosemide  40 mg Intravenous Q12H  . influenza vac split quadrivalent PF  0.5 mL Intramuscular Tomorrow-1000  . insulin aspart  0-20 Units Subcutaneous TID WC  . insulin aspart  10 Units Subcutaneous  TID WC  . insulin detemir  40 Units Subcutaneous BID  . mouth rinse  15 mL Mouth Rinse BID  . zinc sulfate  220 mg Oral Daily     Antibiotics:   Anti-infectives (From admission, onward)   Start     Dose/Rate Route Frequency Ordered Stop   06/24/19 1000  remdesivir 100 mg in sodium chloride 0.9 % 100 mL IVPB     100 mg 200 mL/hr over 30 Minutes Intravenous Daily 06/24/19 0042 06/28/19 0743       Objective   Vitals:   06/28/19 1330 06/28/19 1400 06/28/19 2050 06/29/19 0528  BP: (!) 140/56     Pulse: 84      Resp: (!) 24 18    Temp:   98.1 F (36.7 C) 98.5 F (36.9 C)  TempSrc:   Oral Oral  SpO2:      Weight:      Height:        Intake/Output Summary (Last 24 hours) at 06/29/2019 1355 Last data filed at 06/29/2019 1200 Gross per 24 hour  Intake 300 ml  Output 2050 ml  Net -1750 ml   Filed Weights   06/24/19 0230 06/26/19 0314 06/27/19 0429  Weight: 123 kg 119 kg 118.8 kg     Physical Examination:    General: Appears in no acute distress  Cardiovascular: S1-S2, regular, no murmur auscultated  Respiratory: Decreased breath sounds at lung bases  Abdomen: Abdomen is soft, nontender, no organomegaly  Extremities: No edema in the lower extremities  Neurologic: Cranial nerves II through XII grossly intact, no focal deficit noted     Data Reviewed: I have personally reviewed following labs and imaging studies   No results found for this or any previous visit (from the past 240 hour(s)).   Liver Function Tests: Recent Labs  Lab 06/23/19 2329 06/25/19 0355 06/26/19 0309 06/27/19 0500  AST 78* 101* 83* 56*  ALT 72* 88* 101* 112*  ALKPHOS 182* 164* 139* 123  BILITOT 0.6 0.2* <0.1* 0.3  PROT 6.5 6.1* 5.9* 5.8*  ALBUMIN 2.9* 2.8* 2.7* 2.6*      Studies: CXR Portable same day  Result Date: 06/28/2019 CLINICAL DATA:  Respiratory failure.  COVID-19. EXAM: PORTABLE CHEST 1 VIEW COMPARISON:  06/24/2019 FINDINGS: 1058 hours. Low lung volumes. Slight interval decrease in patchy ill-defined bilateral airspace disease right slightly more than left. No pleural effusion. Cardiopericardial silhouette is at upper limits of normal for size. The visualized bony structures of the thorax are intact. IMPRESSION: Asymmetric ill-defined patchy airspace disease in the mid and lower lungs appears slightly improved in the interval. Electronically Signed   By: Misty Stanley M.D.   On: 06/28/2019 11:23     Admission status: Inpatient: Based on patients clinical presentation and  evaluation of above clinical data, I have made determination that patient meets Inpatient criteria at this time.   Oswald Hillock   [facility] If 7PM-7AM, please contact night-coverage at [email or web address], Office  [phone]  password [email or web address]  06/29/2019, 1:55 PM  LOS: 6 days

## 2019-06-29 NOTE — Progress Notes (Signed)
Occupational Therapy Treatment Patient Details Name: Stephanie [name] MRN: [id number] DOB: July 17, 1960 Today's Date: 06/29/2019    History of present illness 58 year old female who presented with dyspnea.  She does have significant past medical history for type 2 diabetes mellitus, brain tumor, hypertension and morbid obesity.  Patient was transferred from [facility] 06/24/19 after a diagnosis of multifocal pneumonia due to SARS COVID-19.     OT comments  Pt maintaining Sp02 87-92% on 4L throughout session. Completed dressing, toileting, standing grooming and ambulated to remain up in chair with supervision. Progressing well.  Follow Up Recommendations  No OT follow up;Supervision/Assistance - 24 hour    Equipment Recommendations  None recommended by OT    Recommendations for Other Services      Precautions / Restrictions Precautions Precautions: Fall       Mobility Bed Mobility Overal bed mobility: Modified Independent             General bed mobility comments: increased time and use of momentum  Transfers Overall transfer level: Needs assistance Equipment used: None Transfers: Sit to/from Stand Sit to Stand: Supervision         General transfer comment: for safety    Balance Overall balance assessment: Needs assistance   Sitting balance-Leahy Scale: Normal       Standing balance-Leahy Scale: Fair Standing balance comment: statically, reaching for furniture as she walked                           ADL either performed or assessed with clinical judgement   ADL Overall ADL's : Needs assistance/impaired     Grooming: Wash/dry hands;Supervision/safety;Standing           Upper Body Dressing : Set up;Sitting   Lower Body Dressing: Set up;Sitting/lateral leans Lower Body Dressing Details (indicate cue type and reason): bring foot up on bed  Toilet Transfer: Supervision/safety;Ambulation;BSC   Toileting- Clothing Manipulation  and Hygiene: Maximal assistance;Sit to/from stand       Functional mobility during ADLs: Supervision/safety       Vision       Perception     Praxis      Cognition Arousal/Alertness: Awake/alert Behavior During Therapy: WFL for tasks assessed/performed Overall Cognitive Status: Within Functional Limits for tasks assessed                                          Exercises     Shoulder Instructions       General Comments      Pertinent Vitals/ Pain       Pain Assessment: No/denies pain  Home Living                                          Prior Functioning/Environment              Frequency  Min 1X/week        Progress Toward Goals  OT Goals(current goals can now be found in the care plan section)  Progress towards OT goals: Progressing toward goals  Acute Rehab OT Goals Patient Stated Goal: go back to work OT Goal Formulation: With patient Time For Goal Achievement: 07/09/19 Potential to Achieve Goals: Good  Plan Discharge plan remains appropriate  Co-evaluation                 AM-PAC OT "6 Clicks" Daily Activity     Outcome Measure   Help from another person eating meals?: None Help from another person taking care of personal grooming?: A Little Help from another person toileting, which includes using toliet, bedpan, or urinal?: A Little Help from another person bathing (including washing, rinsing, drying)?: A Little Help from another person to put on and taking off regular upper body clothing?: None Help from another person to put on and taking off regular lower body clothing?: None 6 Click Score: 21    End of Session Equipment Utilized During Treatment: Oxygen  OT Visit Diagnosis: Other (comment)(decreased activity tolerance)   Activity Tolerance Patient tolerated treatment well   Patient Left in chair;with call bell/phone within reach   Nurse Communication          Time:  8786-7672 OT Time Calculation (min): 16 min  Charges: OT General Charges $OT Visit: 1 Visit OT Treatments $Self Care/Home Management : 8-22 mins  Nestor Lewandowsky, OTR/L Acute Rehabilitation Services Pager: [phone] Office: [phone]   Malka So 06/29/2019, 1:28 PM

## 2019-06-29 NOTE — Progress Notes (Signed)
  Echocardiogram 2D Echocardiogram has been performed.  Stephanie [name] 06/29/2019, 3:11 PM

## 2019-06-29 NOTE — Progress Notes (Signed)
Physical Therapy Treatment Patient Details Name: Stephanie [name] MRN: [id number] DOB: 11-Sep-1960 Today's Date: 06/29/2019    History of Present Illness 58 year old female who presented with dyspnea.  She does have significant past medical history for type 2 diabetes mellitus, brain tumor, hypertension and morbid obesity.  Patient was transferred from [facility] 06/24/19 after a diagnosis of multifocal pneumonia due to SARS COVID-19.      PT Comments    Pt apprehensive about working with therapy due to [facility] and increased need to urinate. Pt agreed to ambulation to door and back and then agreed to do it again before sitting in recliner. Pt is mod I for bed mobility, supervision for transfers and min guard for ambulation with RW. D/c plans remain appropriate at this time. PT will continue to follow acutely.    Follow Up Recommendations  Home health PT;Supervision for mobility/OOB     Equipment Recommendations  3in1 (PT)       Precautions / Restrictions Precautions Precautions: Fall    Mobility  Bed Mobility Overal bed mobility: Modified Independent             General bed mobility comments: increased time and use of momentum  Transfers Overall transfer level: Needs assistance Equipment used: None Transfers: Sit to/from Stand Sit to Stand: Supervision         General transfer comment: for safety  Ambulation/Gait Ambulation/Gait assistance: Min guard Gait Distance (Feet): 45 Feet Assistive device: Rolling walker (2 wheeled) Gait Pattern/deviations: Step-through pattern;Decreased step length - right;Shuffle;Decreased step length - left Gait velocity: slowed Gait velocity interpretation: 1.31 - 2.62 ft/sec, indicative of limited community ambulator General Gait Details: min guard for ambulation of approximately 45 feet in room. slow waddling gait no overt LoB          Balance Overall balance assessment: Needs assistance   Sitting balance-Leahy  Scale: Normal       Standing balance-Leahy Scale: Fair Standing balance comment: able to static stand without outside support                            Cognition Arousal/Alertness: Awake/alert Behavior During Therapy: WFL for tasks assessed/performed Overall Cognitive Status: Within Functional Limits for tasks assessed                                           General Comments General comments (skin integrity, edema, etc.): Pt on 4L O2 via [place] with SaO2 94%O2 in supine with ambulation SaO2 dropped to 85%O2, however quickly rebounded with sitting and performing pursed lipped breathing      Pertinent Vitals/Pain Pain Assessment: No/denies pain           PT Goals (current goals can now be found in the care plan section) Acute Rehab PT Goals Patient Stated Goal: go back to work PT Goal Formulation: With patient Time For Goal Achievement: 07/09/19 Potential to Achieve Goals: Good Progress towards PT goals: Progressing toward goals    Frequency    Min 3X/week      PT Plan Current plan remains appropriate   AM-PAC PT "6 Clicks" Mobility   Outcome Measure  Help needed turning from your back to your side while in a flat bed without using bedrails?: None Help needed moving from lying on your back to sitting on the side of a flat bed without  using bedrails?: A Little Help needed moving to and from a bed to a chair (including a wheelchair)?: None Help needed standing up from a chair using your arms (e.g., wheelchair or bedside chair)?: None Help needed to walk in hospital room?: A Little Help needed climbing 3-5 steps with a railing? : A Lot 6 Click Score: 20    End of Session Equipment Utilized During Treatment: Gait belt;Oxygen Activity Tolerance: Patient tolerated treatment well Patient left: in chair;with call bell/phone within reach;with chair alarm set Nurse Communication: Mobility status PT Visit Diagnosis: Unsteadiness on feet  (R26.81);Muscle weakness (generalized) (M62.81);Difficulty in walking, not elsewhere classified (R26.2);Pain Pain - Right/Left: (bilateral) Pain - part of body: Knee(popliteal fossa)     Time: 3220-2542 PT Time Calculation (min) (ACUTE ONLY): 17 min  Charges:  $Gait Training: 8-22 mins                     Yama Nielson B. Beverely Risen PT, DPT Acute Rehabilitation Services Pager [phone] Office [phone]    Elon Alas Fleet 06/29/2019, 4:13 PM

## 2019-06-29 NOTE — Plan of Care (Signed)

## 2019-06-29 NOTE — Progress Notes (Signed)
Notified MD of patients blood sugar >400. Will give scheduled insulin and continue to monitor.

## 2019-06-30 ENCOUNTER — Inpatient Hospital Stay (HOSPITAL_COMMUNITY): Payer: HRSA Program

## 2019-06-30 ENCOUNTER — Encounter (HOSPITAL_COMMUNITY): Payer: Self-pay | Admitting: Family Medicine

## 2019-06-30 LAB — BASIC METABOLIC PANEL
Anion gap: 11 (ref 5–15)
BUN: 26 mg/dL — ABNORMAL HIGH (ref 6–20)
CO2: 27 mmol/L (ref 22–32)
Calcium: 8.6 mg/dL — ABNORMAL LOW (ref 8.9–10.3)
Chloride: 102 mmol/L (ref 98–111)
Creatinine, Ser: 0.86 mg/dL (ref 0.44–1.00)
GFR calc Af Amer: >60 mL/min (ref >60)
GFR calc non Af Amer: >60 mL/min (ref >60)
Glucose, Bld: 268 mg/dL — ABNORMAL HIGH (ref 70–99)
Potassium: 4 mmol/L (ref 3.5–5.1)
Sodium: 140 mmol/L (ref 135–145)

## 2019-06-30 LAB — GLUCOSE, CAPILLARY
Glucose-Capillary: 221 mg/dL — ABNORMAL HIGH (ref 70–99)
Glucose-Capillary: 288 mg/dL — ABNORMAL HIGH (ref 70–99)
Glucose-Capillary: 323 mg/dL — ABNORMAL HIGH (ref 70–99)
Glucose-Capillary: 355 mg/dL — ABNORMAL HIGH (ref 70–99)
Glucose-Capillary: 355 mg/dL — ABNORMAL HIGH (ref 70–99)

## 2019-06-30 LAB — TROPONIN I (HIGH SENSITIVITY)
Troponin I (High Sensitivity): 4 ng/L (ref <18)
Troponin I (High Sensitivity): 4 ng/L (ref <18)

## 2019-06-30 LAB — CK: Total CK: 110 U/L (ref 38–234)

## 2019-06-30 LAB — BRAIN NATRIURETIC PEPTIDE: B Natriuretic Peptide: 16.3 pg/mL (ref 0.0–100.0)

## 2019-06-30 MED ORDER — IOHEXOL 350 MG/ML SOLN
100.0000 mL | Freq: Once | INTRAVENOUS | Status: AC | PRN
  Administered 2019-06-30: 15:00:00 100 mL via INTRAVENOUS

## 2019-06-30 MED ORDER — SALINE SPRAY 0.65 % NA SOLN
1.0000 | NASAL | Status: DC | PRN
  Administered 2019-06-30 (×2): 1 via NASAL
  Filled 2019-06-30: qty 44

## 2019-06-30 MED ORDER — ALBUTEROL SULFATE HFA 108 (90 BASE) MCG/ACT IN AERS
2.0000 | INHALATION_SPRAY | Freq: Four times a day (QID) | RESPIRATORY_TRACT | Status: DC
  Administered 2019-06-30 – 2019-07-02 (×9): 2 via RESPIRATORY_TRACT
  Filled 2019-06-30: qty 6.7

## 2019-06-30 NOTE — Progress Notes (Signed)
[facility]  PROGRESS NOTE  Stephanie [name] NLG:[id number] DOB: 1961-06-27 DOA: 06/23/2019 PCP: Patient, No Pcp Per   Brief HPI:   58 year old female with a history of type 2 diabetes mellitus, hypertension, morbid obesity who was transferred from [facility] diagnosis of multifocal pneumonia due to SARS COVID-19.  She had tested positive for SARS COVID-19 on December 19, her symptom consisted of headache, diarrhea, loss of sense of smell and taste, fever, progressive/worsening dyspnea.  She was transferred to [facility] on December 23 for further management.  Chest x-ray showed bilateral interstitial infiltrates.  She was started on remdesivir and Decadron.    Subjective   Patient seen and examined, still requiring 4 L/min of oxygen via nasal cannula.  She was started on IV Lasix 40 mg every 12 hours.  Had decent urine output but no improvement in breathing.   Assessment/Plan:     1. Acute hypoxic respiratory failure-due to SARS COVID-19 pneumonia.  Patient was started on remdesivir and Decadron and has completed 5 days of remdesivir in the hospital.  Her inflammatory markers show significant improvement.  Patient is still requiring 4 L/min of oxygen, chest x-ray shows improvement but still has infiltrates bilaterally.  She was started on IV Lasix 40 mg every hour for possible underlying CHF but no improvement in breathing.  Echocardiogram shows EF 45 to 50% with global hypokinesis.  Will obtain CTA chest to rule out pulmonary embolism.  Try to wean off oxygen as tolerated.    2. Combined systolic  CHF-echocardiogram shows EF 45 to 50% with global hypokinesis.  Not sure if this is contributing to patient's ongoing dyspnea on exertion.  She did not improve much with IV Lasix.  Will consult cardiology  3. Diabetes mellitus type 2, uncontrolled due to steroid induced hyperglycemia-continue sliding scale insulin with NovoLog, blood glucose has improved after  increasing  Levemir to 50 units subcu twice daily, NovoLog 10 units subcu 3 times daily meal coverage.   SpO2: 96 % O2 Flow Rate (L/min): 4 L/min    COVID-19 Labs  Recent Labs    06/28/19 0837  DDIMER 0.83*  FERRITIN 178  CRP 1.5*    No results found for: SARSCOV2NAA  CBG: Recent Labs  Lab 06/29/19 1720 06/29/19 2207 06/30/19 0759 06/30/19 1139 06/30/19 1218  GLUCAP 405* 272* 221* 323* 288*    CBC: Recent Labs  Lab 06/23/19 2329  WBC 4.6  NEUTROABS 3.5  HGB 12.4  HCT 37.6  MCV 84.5  PLT 081    Basic Metabolic Panel: Recent Labs  Lab 06/25/19 0355 06/26/19 0309 06/27/19 0500 06/29/19 0930 06/30/19 0419  NA 137 140 141 136 140  K 4.4 4.4 4.3 3.6 4.0  CL 104 106 109 100 102  CO2 22 23 25 25 27   GLUCOSE 343* 327* 322* 315* 268*  BUN 18 23* 21* 23* 26*  CREATININE 0.81 0.75 0.75 0.77 0.86  CALCIUM 8.5* 8.6* 8.2* 8.5* 8.6*       DVT prophylaxis: Lovenox  Code Status: Full code  Family Communication: No family at bedside  Disposition Plan: likely home when medically ready for discharge        BMI  Estimated body mass index is 51.15 kg/m as calculated from the following:   Height as of this encounter: 5' (1.524 m).   Weight as of this encounter: 118.8 kg.  Scheduled medications:  . albuterol  2 puff Inhalation Q6H  . vitamin C  500 mg Oral Daily  .  chlorpheniramine-HYDROcodone  5 mL Oral Q12H  . dexamethasone (DECADRON) injection  6 mg Intravenous Daily  . enoxaparin (LOVENOX) injection  60 mg Subcutaneous Q24H  . influenza vac split quadrivalent PF  0.5 mL Intramuscular Tomorrow-1000  . insulin aspart  0-20 Units Subcutaneous TID WC  . insulin aspart  10 Units Subcutaneous TID WC  . insulin detemir  50 Units Subcutaneous BID  . mouth rinse  15 mL Mouth Rinse BID  . potassium chloride  20 mEq Oral Daily  . zinc sulfate  220 mg Oral Daily     Antibiotics:   Anti-infectives (From admission, onward)   Start     Dose/Rate  Route Frequency Ordered Stop   06/24/19 1000  remdesivir 100 mg in sodium chloride 0.9 % 100 mL IVPB     100 mg 200 mL/hr over 30 Minutes Intravenous Daily 06/24/19 0042 06/28/19 0743       Objective   Vitals:   06/29/19 1526 06/30/19 0539 06/30/19 0849 06/30/19 1219  BP: (!) 110/58 (!) 96/52 109/63   Pulse: 97 69 97   Resp: 20 (!) 22 18   Temp: 98.4 F (36.9 C) 98.6 F (37 C) 98.7 F (37.1 C) 98.9 F (37.2 C)  TempSrc: Oral Oral Oral Oral  SpO2: 92% 91% 92% 96%  Weight:      Height:        Intake/Output Summary (Last 24 hours) at 06/30/2019 1403 Last data filed at 06/30/2019 0800 Gross per 24 hour  Intake 300 ml  Output 375 ml  Net -75 ml   Filed Weights   06/24/19 0230 06/26/19 0314 06/27/19 0429  Weight: 123 kg 119 kg 118.8 kg     Physical Examination:  General-appears in no acute distress Heart-S1-S2, regular, no murmur auscultated Lungs-decreased breath sounds at lung bases Abdomen-soft, nontender, no organomegaly Extremities-no edema in the lower extremities Neuro-alert, oriented x3, no focal deficit noted     Data Reviewed: I have personally reviewed following labs and imaging studies   No results found for this or any previous visit (from the past 240 hour(s)).   Liver Function Tests: Recent Labs  Lab 06/23/19 2329 06/25/19 0355 06/26/19 0309 06/27/19 0500  AST 78* 101* 83* 56*  ALT 72* 88* 101* 112*  ALKPHOS 182* 164* 139* 123  BILITOT 0.6 0.2* <0.1* 0.3  PROT 6.5 6.1* 5.9* 5.8*  ALBUMIN 2.9* 2.8* 2.7* 2.6*      Studies: ECHOCARDIOGRAM LIMITED  Result Date: 06/29/2019   ECHOCARDIOGRAM LIMITED REPORT   Patient Name:   Stephanie [name] [facility] Date of Exam: 06/29/2019 Medical Rec #:  [id number]                Height:       60.0 in Accession #:    [id number]               Weight:       261.9 lb Date of Birth:  [birth year]                 BSA:          2.09 m Patient Age:    58 years                 BP:           140/56 mmHg Patient  Gender: F                        HR:  84 bpm. Exam Location:  Inpatient  Procedure: Limited Echo Indications:   428 CHF  History:       Patient has no prior history of Echocardiogram examinations.                Covid +.  Sonographer:   Celene SkeenVijay Shankar RDCS (AE) Referring      [address] IllGAGAN S Shaili Donalson Phys:  Sonographer Comments: Image acquisition challenging due to patient body habitus. off axis apical windows. restricted mobility IMPRESSIONS  1. Left ventricular ejection fraction, by visual estimation, is 45 to 50%.  2. Definity contrast agent was given IV to delineate the left ventricular endocardial borders.  3. The left ventricle demonstrates global hypokinesis.  4. Very technically limited study - unable to comment on valves FINDINGS  Left Ventricle: Left ventricular ejection fraction, by visual estimation, is 45 to 50%. Definity contrast agent was given IV to delineate the left ventricular endocardial borders. The left ventricle demonstrates global hypokinesis. Pericardium: There is no evidence of pericardial effusion is seen. There is no evidence of pericardial effusion.  Zoila ShutterKenneth Hilty MD Electronically signed by Zoila ShutterKenneth Hilty MD Signature Date/Time: 06/29/2019/4:05:53 PM   Final      Admission status: Inpatient: Based on patients clinical presentation and evaluation of above clinical data, I have made determination that patient meets Inpatient criteria at this time.   Meredeth IdeGagan S Maurina Fawaz   [facility] If 7PM-7AM, please contact night-coverage at [email or web address], Office  [phone]  password [email or web address]  06/30/2019, 2:03 PM  LOS: 7 days

## 2019-06-30 NOTE — Consult Note (Signed)
Cardiology Consultation:   Due to the COVID-19 pandemic, this visit was completed with telemedicine (audio/video) technology to reduce patient and provider exposure as well as to preserve personal protective equipment.   Patient ID: Stephanie [name] MRN: [id number]; DOB: May 10, 1961  Admit date: 06/23/2019 Date of Consult: 06/30/2019  Primary Care Provider: Patient, No Pcp Per Primary Cardiologist: No primary care provider on file. New Primary Electrophysiologist:  None    Patient Profile:   Stephanie [name] is a 58 y.o. female with a hx of DM2, HTN, HLD, morbid obesity, Klebsiella cerebral abscess s/p craniotomy  who is being seen today for the evaluation of decreased EF and persistent SOB despite Lasix at the request of Dr Sharl Ma.  History of Present Illness:   Stephanie [name] was admitted 12/23 with SOB and COVID PNA from [facility]. She went to [facility] approx 12/20 for worsening SOB. O2 sats were in the 80s initially, improved w/ O2.   Pt stated her whole family caught COVID after a social gathering. Her symptoms started about a week prior to admission.   She had been started on Decadron at [facility], this was continued. She was also started on Remdesivir. She had elevated LFTs. Blood sugars were elevated because of the steroids, levemir increased.   She completed 5 days of Remdesivir, but was still requiring supplemental O2. Respiratory failure felt to be from underlying CHF. She was started on Lasix 40 mg IV BID on 12/27. Amount of overload unclear since output was not charted. No weights in system since 12/27.    Inflammatory markers were improved, but she continue to have SOB. EF 45-50% by echo. Cards asked to see. CTA chest to r/o PE ordered but not yet performed.   I spoke to the patient with the assistance of the telehealth interpreter.  The patient tells me that prior to admission to the hospital she had no chest discomfort, shortness of  breath, or lower extremity edema.  She denies any syncope, dizziness.  She does endorse a cough which worsened just prior to presentation.  She is felt to be short of breath, however speaks in long and full sentences without increased work of breathing.  She does intermittently cough while on the line.  She tells me she has no lower extremity edema at this time and overall she feels she is improving.  CT PE was negative for pulmonary embolism but did show evidence of Covid pneumonia.    Past Medical History:  Diagnosis Date  . Cerebral abscess 2019  . Diabetes mellitus, type 2 (HCC)   . HTN (hypertension)     Past Surgical History:  Procedure Laterality Date  . CRANIOTOMY  2019    Prior to Admission medications   Medication Sig Start Date End Date Taking? Authorizing Provider  insulin NPH Human (NOVOLIN N) 100 UNIT/ML injection Inject 40-44 Units into the skin See admin instructions. Inject 44 units subcutaneously every morning and 40 units at night   Yes [provider]  sitaGLIPtin-metformin (JANUMET) 50-1000 MG tablet Take 1 tablet by mouth 2 (two) times daily with a meal.   Yes [provider]    Inpatient Medications: Scheduled Meds: . albuterol  2 puff Inhalation Q6H  . vitamin C  500 mg Oral Daily  . chlorpheniramine-HYDROcodone  5 mL Oral Q12H  . dexamethasone (DECADRON) injection  6 mg Intravenous Daily  . enoxaparin (LOVENOX) injection  60 mg Subcutaneous Q24H  . influenza vac split quadrivalent PF  0.5 mL  Intramuscular Tomorrow-1000  . insulin aspart  0-20 Units Subcutaneous TID WC  . insulin aspart  10 Units Subcutaneous TID WC  . insulin detemir  50 Units Subcutaneous BID  . mouth rinse  15 mL Mouth Rinse BID  . potassium chloride  20 mEq Oral Daily  . zinc sulfate  220 mg Oral Daily   Continuous Infusions:  PRN Meds: guaiFENesin-dextromethorphan, sodium chloride  Allergies:   No Known Allergies  Social History:   Social History    Socioeconomic History  . Marital status: Single    Spouse name: Not on file  . Number of children: Not on file  . Years of education: Not on file  . Highest education level: Not on file  Occupational History  . Not on file  Tobacco Use  . Smoking status: Never Smoker  . Smokeless tobacco: Never Used  Substance and Sexual Activity  . Alcohol use: Never  . Drug use: Not on file  . Sexual activity: Not on file  Other Topics Concern  . Not on file  Social History Narrative  . Not on file   Social Determinants of Health   Financial Resource Strain:   . Difficulty of Paying Living Expenses: Not on file  Food Insecurity:   . Worried About Programme researcher, broadcasting/film/videounning Out of Food in the Last Year: Not on file  . Ran Out of Food in the Last Year: Not on file  Transportation Needs:   . Lack of Transportation (Medical): Not on file  . Lack of Transportation (Non-Medical): Not on file  Physical Activity:   . Days of Exercise per Week: Not on file  . Minutes of Exercise per Session: Not on file  Stress:   . Feeling of Stress : Not on file  Social Connections:   . Frequency of Communication with Friends and Family: Not on file  . Frequency of Social Gatherings with Friends and Family: Not on file  . Attends Religious Services: Not on file  . Active Member of Clubs or Organizations: Not on file  . Attends BankerClub or Organization Meetings: Not on file  . Marital Status: Not on file  Intimate Partner Violence:   . Fear of Current or Ex-Partner: Not on file  . Emotionally Abused: Not on file  . Physically Abused: Not on file  . Sexually Abused: Not on file    Family History:   History reviewed. No pertinent family history.  Family Status:  has no family status information on file.   ROS:  Please see the history of present illness.   All other ROS reviewed and negative.     Physical Exam/Data:   Vitals:   06/29/19 1526 06/30/19 0539 06/30/19 0849 06/30/19 1219  BP: (!) 110/58 (!) 96/52 109/63    Pulse: 97 69 97   Resp: 20 (!) 22 18   Temp: 98.4 F (36.9 C) 98.6 F (37 C) 98.7 F (37.1 C) 98.9 F (37.2 C)  TempSrc: Oral Oral Oral Oral  SpO2: 92% 91% 92% 96%  Weight:      Height:        Intake/Output Summary (Last 24 hours) at 06/30/2019 1433 Last data filed at 06/30/2019 0800 Gross per 24 hour  Intake 300 ml  Output 375 ml  Net -75 ml   Last 3 Weights 06/27/2019 06/26/2019 06/24/2019  Weight (lbs) 261 lb 14.5 oz 262 lb 5.6 oz 271 lb 3.2 oz  Weight (kg) 118.8 kg 119 kg 123.016 kg     Body mass  index is 51.15 kg/m.   VITAL SIGNS:  reviewed GEN:  no acute distress RESPIRATORY:  normal respiratory effort, no increased work of breathing. Easily speaks in full sentences without dyspnea PSYCH:  normal affect  EKG:  The EKG was personally reviewed and demonstrates:  None in Epic Telemetry:  Telemetry was personally reviewed and demonstrates:  SR, sinus brady  Relevant CV Studies: ECHO, LIMITED: 06/29/2019   1. Left ventricular ejection fraction, by visual estimation, is 45 to 50%.  2. Definity contrast agent was given IV to delineate the left ventricular endocardial borders.  3. The left ventricle demonstrates global hypokinesis.  4. Very technically limited study - unable to comment on valves  FINDINGS  Left Ventricle: Left ventricular ejection fraction, by visual estimation, is 45 to 50%. Definity contrast agent was given IV to delineate the left ventricular endocardial borders. The left ventricle demonstrates global hypokinesis.  Pericardium: There is no evidence of pericardial effusion is seen. There is no evidence of pericardial effusion.  Laboratory Data:  Chemistry Recent Labs  Lab 06/27/19 0500 06/29/19 0930 06/30/19 0419  NA 141 136 140  K 4.3 3.6 4.0  CL 109 100 102  CO2 25 25 27   GLUCOSE 322* 315* 268*  BUN 21* 23* 26*  CREATININE 0.75 0.77 0.86  CALCIUM 8.2* 8.5* 8.6*  GFRNONAA >60 >60 >60  GFRAA >60 >60 >60  ANIONGAP 7 11 11      Recent Labs  Lab 06/25/19 0355 06/26/19 0309 06/27/19 0500  PROT 6.1* 5.9* 5.8*  ALBUMIN 2.8* 2.7* 2.6*  AST 101* 83* 56*  ALT 88* 101* 112*  ALKPHOS 164* 139* 123  BILITOT 0.2* <0.1* 0.3   Hematology Recent Labs  Lab 06/23/19 2329  WBC 4.6  RBC 4.45  HGB 12.4  HCT 37.6  MCV 84.5  MCH 27.9  MCHC 33.0  RDW 14.5  PLT 196   Cardiac Enzymes High Sensitivity Troponin:  No results for input(s): TROPONINIHS in the last 720 hours.      BNP Recent Labs  Lab 06/30/19 1033  BNP 16.3    DDimer  Recent Labs  Lab 06/26/19 0309 06/27/19 0500 06/28/19 0837  DDIMER 0.49 0.49 0.83*   No results found for: TSH   Radiology/Studies:  CXR Portable same day  Result Date: 06/28/2019 CLINICAL DATA:  Respiratory failure.  COVID-19. EXAM: PORTABLE CHEST 1 VIEW COMPARISON:  06/24/2019 FINDINGS: 1058 hours. Low lung volumes. Slight interval decrease in patchy ill-defined bilateral airspace disease [name] slightly more than left. No pleural effusion. Cardiopericardial silhouette is at upper limits of normal for size. The visualized bony structures of the thorax are intact. IMPRESSION: Asymmetric ill-defined patchy airspace disease in the mid and lower lungs appears slightly improved in the interval. Electronically Signed   By: Misty Stanley M.D.   On: 06/28/2019 11:23   ECHOCARDIOGRAM LIMITED  Result Date: 06/29/2019   ECHOCARDIOGRAM LIMITED REPORT   Patient Name:   DERIAN PFOST [facility] Date of Exam: 06/29/2019 Medical Rec #:  [id number]                Height:       60.0 in Accession #:    [id number]               Weight:       261.9 lb Date of Birth:  20-Nov-1960                 BSA:          2.09 m Patient  Age:    58 years                 BP:           140/56 mmHg Patient Gender: F                        HR:           84 bpm. Exam Location:  Inpatient  Procedure: Limited Echo Indications:   428 CHF  History:       Patient has no prior history of Echocardiogram examinations.                 Covid +.  Sonographer:   Celene Skeen RDCS (AE) Referring      [address] Ill LAMA Phys:  Sonographer Comments: Image acquisition challenging due to patient body habitus. off axis apical windows. restricted mobility IMPRESSIONS  1. Left ventricular ejection fraction, by visual estimation, is 45 to 50%.  2. Definity contrast agent was given IV to delineate the left ventricular endocardial borders.  3. The left ventricle demonstrates global hypokinesis.  4. Very technically limited study - unable to comment on valves FINDINGS  Left Ventricle: Left ventricular ejection fraction, by visual estimation, is 45 to 50%. Definity contrast agent was given IV to delineate the left ventricular endocardial borders. The left ventricle demonstrates global hypokinesis. Pericardium: There is no evidence of pericardial effusion is seen. There is no evidence of pericardial effusion.  Zoila Shutter MD Electronically signed by Zoila Shutter MD Signature Date/Time: 06/29/2019/4:05:53 PM   Final     @   Assessment and Plan:   Mildly reduced LV function and dyspnea-I spoke to the patient at length regarding her symptoms both prior to hospitalization and in hospital.  It appears she has no cardiovascular symptoms prior to contracting COVID-19 and here she feels she is improving with no lower extremity edema. Her PE study is negative for PE but does show extensive groundglass infiltrates consistent with Covid pneumonia.  With regard to dyspnea, recommend continuing therapy for Covid pneumonia.  It does not seem that Lasix therapy is impacting her shortness of breath significantly, unlikely that she needs continued diuresis.  I have independently reviewed the limited echocardiogram obtained while in house, there is perhaps mild RV dysfunction at the apex with mildly enlarged RV size.  Without evidence of PE, this is likely secondary to acute pulmonary process with Covid pneumonia.  With reduced LV function,  troponins have been unremarkable.  Her mildly reduced EF certainly warrants reevaluation as an outpatient with a repeat complete echo at follow-up in 6 to 8 weeks.  If EF remains low, an ischemic evaluation is warranted.  She has mild coronary artery calcifications on her PE study.  She is a diabetic.   - Consider starting ACE inhibitor and/or beta-blocker when blood pressure will tolerate, at this current time blood pressure is low and she may not tolerate medical therapy for CAD.  - Consider starting an aspirin if no concerns of bleeding or intracranial pathology.   -Initiate statin therapy, at least atorvastatin 40 mg daily if no history of intolerance to statin therapy. -Check a lipid profile -I will arrange cardiology follow up in 6 weeks with echo beforehand to reassess.    Principal Problem:   Pneumonia due to COVID-19 virus Active Problems:   Type 2 diabetes mellitus without complication, with long-term current use of insulin (HCC)   Morbid obesity (HCC)  For  questions or updates, please contact [facility] Please consult [email or web address] for contact info under     Signed, Parke Poisson 06/30/2019 2:33 PM

## 2019-06-30 NOTE — TOC Initial Note (Addendum)
Transition of Care [facility]) - Initial/Assessment Note    Patient Details  Name: Stephanie [name] MRN: [id number] Date of Birth: June 30, 1961  Transition of Care [facility]) CM/SW Contact:    Pollie Friar, RN Phone Number: 06/30/2019, 12:25 PM  Clinical Narrative:                 When medically ready plan is for patient to d/c home with charity services for [facility] and oxygen. CM reached out to the patient and she states she can not afford the oxygen as private pay. Since pt is without SS# she doesn't qualify for charity oxygen. CM has sent a LOG form to team lead to see if can be approved when pt is ready for d/c home.  PCP: [place] [phone]  TOC following.  Addendum: LOG for home oxygen approved and will be sent to [employer]. Zack with [employer] updated.   Expected Discharge Plan: [place] Barriers to Discharge: Continued Medical Work up   Patient Goals and CMS Choice        Expected Discharge Plan and Services Expected Discharge Plan: [place]   Discharge Planning Services: CM Consult Post Acute Care Choice: Home Health, Durable Medical Equipment Living arrangements for the past 2 months: Single Family Home                 DME Arranged: Oxygen(through LOG) DME Agency: [employer] Date DME Agency Contacted: 06/30/19   Representative spoke with at DME Agency: Zack            Prior Living Arrangements/Services Living arrangements for the past 2 months: [place] Lives with:: Spouse Patient language and need for interpreter reviewed:: Yes(Spanish---used Graciela) Do you feel safe going back to the place where you live?: Yes        Care giver support system in place?: Yes (comment)   Criminal Activity/Legal Involvement Pertinent to Current Situation/Hospitalization: No - Comment as needed  Activities of Daily Living Home Assistive Devices/Equipment: CBG Meter, Dentures (specify type),  Eyeglasses, Other (Comment)(Pulse oximeter.) ADL Screening (condition at time of admission) Patient's cognitive ability adequate to safely complete daily activities?: Yes Is the patient deaf or have difficulty hearing?: No Does the patient have difficulty seeing, even when wearing glasses/contacts?: No Does the patient have difficulty concentrating, remembering, or making decisions?: No Patient able to express need for assistance with ADLs?: Yes Does the patient have difficulty dressing or bathing?: No Independently performs ADLs?: Yes (appropriate for developmental age) Does the patient have difficulty walking or climbing stairs?: Yes Weakness of Legs: Both Weakness of Arms/Hands: None  Permission Sought/Granted                  Emotional Assessment       Orientation: : Oriented to Self, Oriented to Place, Oriented to  Time, Oriented to Situation   Psych Involvement: No (comment)  Admission diagnosis:  Pneumonia due to COVID-19 virus [U07.1, J12.89] Patient Active Problem List   Diagnosis Date Noted  . Morbid obesity ([place]) 06/27/2019  . Transaminitis 06/24/2019  . Type 2 diabetes mellitus without complication, with long-term current use of insulin ([place]) 06/24/2019  . Pneumonia due to COVID-19 virus 06/23/2019   PCP:  Patient, No Pcp Per Pharmacy:   [facility] [address] [place] [address] Phone: [phone] Fax: [phone]     Social Determinants of Health (  SDOH) Interventions    Readmission Risk Interventions No flowsheet data found.

## 2019-06-30 NOTE — Plan of Care (Signed)

## 2019-07-01 ENCOUNTER — Other Ambulatory Visit: Payer: Self-pay | Admitting: Cardiology

## 2019-07-01 DIAGNOSIS — I429 Cardiomyopathy, unspecified: Secondary | ICD-10-CM

## 2019-07-01 DIAGNOSIS — J9601 Acute respiratory failure with hypoxia: Secondary | ICD-10-CM

## 2019-07-01 LAB — GLUCOSE, CAPILLARY
Glucose-Capillary: 218 mg/dL — ABNORMAL HIGH (ref 70–99)
Glucose-Capillary: 376 mg/dL — ABNORMAL HIGH (ref 70–99)
Glucose-Capillary: 439 mg/dL — ABNORMAL HIGH (ref 70–99)
Glucose-Capillary: 443 mg/dL — ABNORMAL HIGH (ref 70–99)
Glucose-Capillary: 421 mg/dL — ABNORMAL HIGH (ref 70–99)
Glucose-Capillary: 333 mg/dL — ABNORMAL HIGH (ref 70–99)

## 2019-07-01 LAB — BASIC METABOLIC PANEL
Anion gap: 12 (ref 5–15)
BUN: 28 mg/dL — ABNORMAL HIGH (ref 6–20)
CO2: 22 mmol/L (ref 22–32)
Calcium: 8.5 mg/dL — ABNORMAL LOW (ref 8.9–10.3)
Chloride: 99 mmol/L (ref 98–111)
Creatinine, Ser: 0.91 mg/dL (ref 0.44–1.00)
GFR calc Af Amer: >60 mL/min (ref >60)
GFR calc non Af Amer: >60 mL/min (ref >60)
Glucose, Bld: 440 mg/dL — ABNORMAL HIGH (ref 70–99)
Potassium: 4.7 mmol/L (ref 3.5–5.1)
Sodium: 133 mmol/L — ABNORMAL LOW (ref 135–145)

## 2019-07-01 LAB — GLUCOSE, RANDOM: Glucose, Bld: 384 mg/dL — ABNORMAL HIGH (ref 70–99)

## 2019-07-01 MED ORDER — ASPIRIN EC 81 MG PO TBEC
81.0000 mg | DELAYED_RELEASE_TABLET | Freq: Every day | ORAL | Status: DC
  Administered 2019-07-01 – 2019-07-02 (×2): 81 mg via ORAL
  Filled 2019-07-01 (×2): qty 1

## 2019-07-01 MED ORDER — ATORVASTATIN CALCIUM 40 MG PO TABS
40.0000 mg | ORAL_TABLET | Freq: Every day | ORAL | Status: DC
  Administered 2019-07-01: 19:00:00 40 mg via ORAL
  Filled 2019-07-01: qty 1

## 2019-07-01 MED ORDER — INSULIN ASPART 100 UNIT/ML ~~LOC~~ SOLN
14.0000 [IU] | Freq: Three times a day (TID) | SUBCUTANEOUS | Status: DC
  Administered 2019-07-01 – 2019-07-02 (×2): 14 [IU] via SUBCUTANEOUS

## 2019-07-01 MED ORDER — INSULIN DETEMIR 100 UNIT/ML ~~LOC~~ SOLN
55.0000 [IU] | Freq: Two times a day (BID) | SUBCUTANEOUS | Status: DC
  Administered 2019-07-01 – 2019-07-02 (×2): 55 [IU] via SUBCUTANEOUS
  Filled 2019-07-01 (×3): qty 0.55

## 2019-07-01 NOTE — Plan of Care (Signed)
  Problem: Education: Goal: Knowledge of General Education information will improve Description Including pain rating scale, medication(s)/side effects and non-pharmacologic comfort measures Outcome: Progressing   Problem: Health Behavior/Discharge Planning: Goal: Ability to manage health-related needs will improve Outcome: Progressing   

## 2019-07-01 NOTE — Progress Notes (Signed)
Results for ELEORA, SUTHERLAND (MRN [id number]) as of 07/01/2019 13:16  Ref. Range 06/30/2019 12:18 06/30/2019 17:04 06/30/2019 21:39 07/01/2019 08:05 07/01/2019 12:17  Glucose-Capillary Latest Ref Range: 70 - 99 mg/dL 288 (H) 355 (H) 355 (H) 218 (H) 376 (H)  Noted that blood sugars continue to be greater than 200 mg/dl.   Recommend increasing Levemir to 55 units BID, continue Novolog RESISTANT correction scale TID, add Novolog HS scale, and increase Novolog meal coverage to 14 units TID if patient eats at least 50 % of meal.   Harvel Ricks RN BSN CDE Diabetes Coordinator Pager: [phone]  8am-5pm

## 2019-07-01 NOTE — Progress Notes (Signed)
Physical Therapy Treatment Patient Details Name: Stephanie [name] MRN: [id number] DOB: Jan 29, 1961 Today's Date: 07/01/2019    History of Present Illness 58 year old female who presented with dyspnea.  She does have significant past medical history for type 2 diabetes mellitus, brain tumor, hypertension and morbid obesity.  Patient was transferred from [facility] 06/24/19 after a diagnosis of multifocal pneumonia due to SARS COVID-19.      PT Comments    Patient on room air on arrival, up in chair with sats 95%. Walked in room 45 ft with sat monitor not registering good waveform, when it began reading sats 90%. Seated rest and walked again with monitor keeping a better waveform with sats 87% after 40ft, stood with deep breathing and returned to 90% in <10 seconds. On return to chair remained 88-90%. Mild shortness of breath but able to talk throughout (via interpreter). Hopeful pt will not need home O2 as the tubing may be a challenge for her to manage (ie. Tripping hazard).     Follow Up Recommendations  Home health PT;Supervision for mobility/OOB     Equipment Recommendations  None recommended by PT    Recommendations for Other Services       Precautions / Restrictions Precautions Precautions: Fall Restrictions Weight Bearing Restrictions: No    Mobility  Bed Mobility                  Transfers Overall transfer level: Modified independent Equipment used: None Transfers: Sit to/from Stand Sit to Stand: Modified independent (Device/Increase time)         General transfer comment: multiple transfers during session (recliner and BSC) +use of UEs but no imbalance  Ambulation/Gait Ambulation/Gait assistance: Min guard;Min assist Gait Distance (Feet): 45 Feet(seated rest, 45 ft) Assistive device: None Gait Pattern/deviations: Step-through pattern;Decreased step length - right;Shuffle;Decreased step length - left;Staggering left Gait velocity: slowed    General Gait Details: min assist for ambulation (one LOB with turning left quickly, ?her foot slipped in her sandal)of approximately 45 feet in room; sats on room air 87-91% (with standing rest break would increase to 90% in <10 seconds   Stairs             Wheelchair Mobility    Modified Rankin (Stroke Patients Only)       Balance Overall balance assessment: Needs assistance Sitting-balance support: Feet supported;No upper extremity supported Sitting balance-Leahy Scale: Normal     Standing balance support: No upper extremity supported;During functional activity Standing balance-Leahy Scale: Good Standing balance comment: standing to wipe herself after toileting; able to reach to floor to pick up item                            Cognition Arousal/Alertness: Awake/alert Behavior During Therapy: [facility] for tasks assessed/performed Overall Cognitive Status: Within Functional Limits for tasks assessed                                        Exercises Other Exercises Other Exercises: Incentive spirometer x 2 ; educated to incr from 5 reps/hour (what she's been doing) to 10x/hour    General Comments General comments (skin integrity, edema, etc.): via interpreter Dahlia (virtual, ipad); pt with multiple questions re: possible need for home O2; concerned re: tripping hazard of O2tubing if husband gone to work      Pertinent Vitals/Pain Pain Assessment:  No/denies pain    Home Living                      Prior Function            PT Goals (current goals can now be found in the care plan section) Acute Rehab PT Goals Patient Stated Goal: go back to work Time For Goal Achievement: 07/09/19 Potential to Achieve Goals: Good Progress towards PT goals: Progressing toward goals    Frequency    Min 3X/week      PT Plan Current plan remains appropriate    Co-evaluation              AM-PAC PT "6 Clicks" Mobility    Outcome Measure  Help needed turning from your back to your side while in a flat bed without using bedrails?: None Help needed moving from lying on your back to sitting on the side of a flat bed without using bedrails?: A Little Help needed moving to and from a bed to a chair (including a wheelchair)?: None Help needed standing up from a chair using your arms (e.g., wheelchair or bedside chair)?: None Help needed to walk in hospital room?: A Little Help needed climbing 3-5 steps with a railing? : A Lot 6 Click Score: 20    End of Session Equipment Utilized During Treatment: Gait belt Activity Tolerance: Patient tolerated treatment well Patient left: in chair;with call bell/phone within reach;with chair alarm set   PT Visit Diagnosis: Unsteadiness on feet (R26.81);Muscle weakness (generalized) (M62.81);Difficulty in walking, not elsewhere classified (R26.2);Pain     Time: 4034-7425 PT Time Calculation (min) (ACUTE ONLY): 31 min  Charges:  $Gait Training: 8-22 mins $Self Care/Home Management: 8-22                      Arby Barrette, PT Pager [phone]    Rexanne Mano 07/01/2019, 12:29 PM

## 2019-07-01 NOTE — Progress Notes (Addendum)
[facility]  PROGRESS NOTE  Stephanie [name] YTK:[id number] DOB: 09/24/1960 DOA: 06/23/2019 PCP: Patient, No Pcp Per   Brief HPI:   58 year old female with a history of type 2 diabetes mellitus, hypertension, morbid obesity who was transferred from [facility] diagnosis of multifocal pneumonia due to SARS COVID-19.  She had tested positive for SARS COVID-19 on December 19, her symptom consisted of headache, diarrhea, loss of sense of smell and taste, fever, progressive/worsening dyspnea.  She was transferred to [facility] on December 23 for further management.  Chest x-ray showed bilateral interstitial infiltrates.  She was started on remdesivir and Decadron.    Subjective   Patient seen and examined, continues to require 4 L/min of oxygen.  O2 sats 94%.  CTA chest negative for pulmonary embolism.  Continues to show bilateral groundglass opacities consistent with multifocal pneumonia.   Assessment/Plan:     1. Acute hypoxic respiratory failure-due to SARS COVID-19 pneumonia.  Patient was started on remdesivir and Decadron and has completed 5 days of remdesivir in the hospital.  Her inflammatory markers show significant improvement.  Patient is still requiring 4 L/min of oxygen, chest x-ray shows improvement but still has infiltrates bilaterally.  She was started on IV Lasix 40 mg every hour for possible underlying CHF but no improvement in breathing.  Echocardiogram shows EF 45 to 50% with global hypokinesis.  CT chest was obtained which is negative for pulmonary embolism.  Continues to show bilateral groundglass opacities consistent with multifocal pneumonia.   Try to wean off oxygen as tolerated.  Incentive spirometry every hour while awake, flutter valve every 4 hours.  Self prone as much possible.  2. Combined systolic  CHF-echocardiogram shows EF 45 to 50% with global hypokinesis.  Not sure if this is contributing to patient's ongoing dyspnea on exertion.   Cardiology was consulted, appreciate cardiology consultation.  Patient started on aspirin and atorvastatin as per cardiology recommendation.  She will follow up with cardiology as outpatient.  No intervention recommended at this time.    3. Diabetes mellitus type 2, uncontrolled due to steroid induced hyperglycemia-continue sliding scale insulin with NovoLog, blood glucose has improved after increasing  Levemir to 50 units subcu twice daily, NovoLog 10 units subcu 3 times daily meal coverage.   CRP was 1.5 on 06/28/19 Ferritin 178 on 06/28/2019  SpO2: 94 % O2 Flow Rate (L/min): 4 L/min   BNP    Component Value Date/Time   BNP 16.3 06/30/2019 1033   Cardiac Panel (last 3 results) Recent Labs    06/30/19 1441 06/30/19 1603  CKTOTAL 110  --   TROPONINIHS 4 4    CBG: Recent Labs  Lab 06/30/19 1139 06/30/19 1218 06/30/19 1704 06/30/19 2139 07/01/19 0805  GLUCAP 323* 288* 355* 355* 218*    CBC: No results for input(s): WBC, NEUTROABS, HGB, HCT, MCV, PLT in the last 168 hours.  Basic Metabolic Panel: Recent Labs  Lab 06/25/19 0355 06/26/19 0309 06/27/19 0500 06/29/19 0930 06/30/19 0419  NA 137 140 141 136 140  K 4.4 4.4 4.3 3.6 4.0  CL 104 106 109 100 102  CO2 22 23 25 25 27   GLUCOSE 343* 327* 322* 315* 268*  BUN 18 23* 21* 23* 26*  CREATININE 0.81 0.75 0.75 0.77 0.86  CALCIUM 8.5* 8.6* 8.2* 8.5* 8.6*       DVT prophylaxis: Lovenox  Code Status: Full code  Family Communication: No family at bedside  Disposition Plan: likely home when medically ready for  discharge        BMI  Estimated body mass index is 51.15 kg/m as calculated from the following:   Height as of this encounter: 5' (1.524 m).   Weight as of this encounter: 118.8 kg.  Scheduled medications:  . albuterol  2 puff Inhalation Q6H  . vitamin C  500 mg Oral Daily  . aspirin EC  81 mg Oral Daily  . atorvastatin  40 mg Oral q1800  . chlorpheniramine-HYDROcodone  5 mL Oral Q12H  .  dexamethasone (DECADRON) injection  6 mg Intravenous Daily  . enoxaparin (LOVENOX) injection  60 mg Subcutaneous Q24H  . influenza vac split quadrivalent PF  0.5 mL Intramuscular Tomorrow-1000  . insulin aspart  0-20 Units Subcutaneous TID WC  . insulin aspart  10 Units Subcutaneous TID WC  . insulin detemir  50 Units Subcutaneous BID  . mouth rinse  15 mL Mouth Rinse BID  . potassium chloride  20 mEq Oral Daily  . zinc sulfate  220 mg Oral Daily     Antibiotics:   Anti-infectives (From admission, onward)   Start     Dose/Rate Route Frequency Ordered Stop   06/24/19 1000  remdesivir 100 mg in sodium chloride 0.9 % 100 mL IVPB     100 mg 200 mL/hr over 30 Minutes Intravenous Daily 06/24/19 0042 06/28/19 0743       Objective   Vitals:   06/30/19 0849 06/30/19 1219 06/30/19 2139 07/01/19 0650  BP: 109/63  (!) 109/49 (!) 145/83  Pulse: 97  81 74  Resp: 18     Temp: 98.7 F (37.1 C) 98.9 F (37.2 C) 98.7 F (37.1 C) 98.7 F (37.1 C)  TempSrc: Oral Oral Oral Oral  SpO2: 92% 96% 92% 94%  Weight:      Height:        Intake/Output Summary (Last 24 hours) at 07/01/2019 1149 Last data filed at 07/01/2019 0830 Gross per 24 hour  Intake 340 ml  Output --  Net 340 ml   Filed Weights   06/24/19 0230 06/26/19 0314 06/27/19 0429  Weight: 123 kg 119 kg 118.8 kg     Physical Examination:  General-appears in no acute distress Heart-S1-S2, regular, no murmur auscultated Lungs-clear to auscultation bilaterally, no wheezing or crackles auscultated Abdomen-soft, nontender, no organomegaly Extremities-no edema in the lower extremities Neuro-alert, oriented x3, no focal deficit noted    Data Reviewed: I have personally reviewed following labs and imaging studies   No results found for this or any previous visit (from the past 240 hour(s)).   Liver Function Tests: Recent Labs  Lab 06/25/19 0355 06/26/19 0309 06/27/19 0500  AST 101* 83* 56*  ALT 88* 101* 112*   ALKPHOS 164* 139* 123  BILITOT 0.2* <0.1* 0.3  PROT 6.1* 5.9* 5.8*  ALBUMIN 2.8* 2.7* 2.6*      Studies: CT ANGIO CHEST PE W OR WO CONTRAST  Result Date: 06/30/2019 CLINICAL DATA:  Shortness of breath.  COVID pneumonia. EXAM: CT ANGIOGRAPHY CHEST WITH CONTRAST TECHNIQUE: Multidetector CT imaging of the chest was performed using the standard protocol during bolus administration of intravenous contrast. Multiplanar CT image reconstructions and MIPs were obtained to evaluate the vascular anatomy. CONTRAST:  OMNIPAQUE IOHEXOL 350 MG/ML SOLN COMPARISON:  Chest x-ray 06/28/2019 FINDINGS: Cardiovascular: The heart is normal in size. No pericardial effusion. The aorta is normal in caliber. No dissection. The branch vessels are patent. A few scattered coronary artery calcifications are noted. The pulmonary arterial tree is  fairly well opacified. No definite filling defects to suggest pulmonary embolism. Mediastinum/Nodes: Scattered borderline mediastinal and hilar lymph nodes likely inflammatory/hyperplastic. The esophagus is grossly normal. Lungs/Pleura: Extensive bilateral ground-glass type infiltrates in both lungs consistent with COVID pneumonia. No pleural effusions. Upper Abdomen: Diffuse and fairly marked fatty infiltration of the liver but no focal hepatic lesions. No adrenal gland lesions. The gallbladder is surgically absent. Musculoskeletal: No breast masses, supraclavicular or axillary adenopathy. The thyroid gland is grossly normal. No significant bony findings. Review of the MIP images confirms the above findings. IMPRESSION: 1. Extensive bilateral ground-glass type infiltrates consistent with COVID pneumonia. 2. No CT findings for pulmonary embolism. 3. Normal thoracic aorta. Aortic Atherosclerosis (ICD10-I70.0). Electronically Signed   By: Rudie MeyerP.  Gallerani M.D.   On: 06/30/2019 15:45   ECHOCARDIOGRAM LIMITED  Result Date: 06/29/2019   ECHOCARDIOGRAM LIMITED REPORT   Patient Name:    Stephanie [name] Date of Exam: 06/29/2019 Medical Rec #:  [id number]                Height:       60.0 in Accession #:    [id number]               Weight:       261.9 lb Date of Birth:  07/17/1960                 BSA:          2.09 m Patient Age:    58 years                 BP:           140/56 mmHg Patient Gender: F                        HR:           84 bpm. Exam Location:  Inpatient  Procedure: Limited Echo Indications:   428 CHF  History:       Patient has no prior history of Echocardiogram examinations.                Covid +.  Sonographer:   Celene SkeenVijay Shankar RDCS (AE) Referring      [address] IllGAGAN S Terrilee Dudzik Phys:  Sonographer Comments: Image acquisition challenging due to patient body habitus. off axis apical windows. restricted mobility IMPRESSIONS  1. Left ventricular ejection fraction, by visual estimation, is 45 to 50%.  2. Definity contrast agent was given IV to delineate the left ventricular endocardial borders.  3. The left ventricle demonstrates global hypokinesis.  4. Very technically limited study - unable to comment on valves FINDINGS  Left Ventricle: Left ventricular ejection fraction, by visual estimation, is 45 to 50%. Definity contrast agent was given IV to delineate the left ventricular endocardial borders. The left ventricle demonstrates global hypokinesis. Pericardium: There is no evidence of pericardial effusion is seen. There is no evidence of pericardial effusion.  Zoila ShutterKenneth Hilty MD Electronically signed by Zoila ShutterKenneth Hilty MD Signature Date/Time: 06/29/2019/4:05:53 PM   Final      Admission status: Inpatient: Based on patients clinical presentation and evaluation of above clinical data, I have made determination that patient meets Inpatient criteria at this time.   Meredeth IdeGagan S Hayward Rylander   [facility] If 7PM-7AM, please contact night-coverage at [email or web address], Office  [phone]  password [email or web address]  07/01/2019, 11:49 AM  LOS: 8 days

## 2019-07-02 LAB — GLUCOSE, CAPILLARY: Glucose-Capillary: 228 mg/dL — ABNORMAL HIGH (ref 70–99)

## 2019-07-02 MED ORDER — GUAIFENESIN-DM 100-10 MG/5ML PO SYRP: 5.0000 mL | ORAL_SOLUTION | ORAL | 0 refills | Status: DC | PRN

## 2019-07-02 MED ORDER — ASCORBIC ACID 500 MG PO TABS: 500.0000 mg | ORAL_TABLET | Freq: Every day | ORAL | 0 refills | Status: DC

## 2019-07-02 MED ORDER — ASPIRIN 81 MG PO TBEC: 81.0000 mg | DELAYED_RELEASE_TABLET | Freq: Every day | ORAL | 2 refills | Status: DC

## 2019-07-02 MED ORDER — ATORVASTATIN CALCIUM 40 MG PO TABS: 40.0000 mg | ORAL_TABLET | Freq: Every day | ORAL | 2 refills | Status: DC

## 2019-07-02 MED ORDER — ZINC SULFATE 220 (50 ZN) MG PO CAPS: 220.0000 mg | ORAL_CAPSULE | Freq: Every day | ORAL | 0 refills | Status: DC

## 2019-07-02 MED ORDER — ALBUTEROL SULFATE HFA 108 (90 BASE) MCG/ACT IN AERS: 2.0000 | INHALATION_SPRAY | Freq: Four times a day (QID) | RESPIRATORY_TRACT | 1 refills | Status: DC | PRN

## 2019-07-02 MED FILL — ASPIRIN LOW DOSE 81 MG TBEC: 81 | 30 days supply | Qty: 30 | Fill #0

## 2019-07-02 MED FILL — ATORVASTATIN CALCIUM 40 MG: 40 | 30 days supply | Qty: 30 | Fill #0

## 2019-07-02 MED FILL — SM TUSSIN DM SYRUP: 100-10 | 7 days supply | Qty: 236 | Fill #0

## 2019-07-02 MED FILL — VITAMIN C 500 MG TABLET: 500 | 10 days supply | Qty: 10 | Fill #0

## 2019-07-02 MED FILL — ZINC SULFATE 220 MG TABLET: 220 (50 ZN) | 10 days supply | Qty: 10 | Fill #0

## 2019-07-02 NOTE — Plan of Care (Signed)
  Problem: Education: Goal: Knowledge of General Education information will improve Description: Including pain rating scale, medication(s)/side effects and non-pharmacologic comfort measures Outcome: Adequate for Discharge   Problem: Health Behavior/Discharge Planning: Goal: Ability to manage health-related needs will improve Outcome: Adequate for Discharge   Problem: Clinical Measurements: Goal: Ability to maintain clinical measurements within normal limits will improve Outcome: Adequate for Discharge   Problem: Clinical Measurements: Goal: Will remain free from infection Outcome: Adequate for Discharge   Problem: Clinical Measurements: Goal: Diagnostic test results will improve Outcome: Adequate for Discharge   Problem: Clinical Measurements: Goal: Respiratory complications will improve Outcome: Adequate for Discharge   Problem: Clinical Measurements: Goal: Cardiovascular complication will be avoided Outcome: Adequate for Discharge   

## 2019-07-02 NOTE — Progress Notes (Signed)
Nsg Discharge Note  Admit Date:  06/23/2019 Discharge date: 07/02/2019   Stephanie [name] to be D/C'd Home per MD order.  AVS completed.  Copy for chart, and copy for patient signed, and dated. Patient/caregiver able to verbalize understanding.  Discharge Medication: Allergies as of 07/02/2019   No Known Allergies     Medication List    TAKE these medications   albuterol 108 (90 Base) MCG/ACT inhaler Commonly known as: VENTOLIN HFA Inhale 2 puffs into the lungs every 6 (six) hours as needed for wheezing or shortness of breath.   ascorbic acid 500 MG tablet Commonly known as: VITAMIN C Take 1 tablet (500 mg total) by mouth daily. Start taking on: July 03, 2019   aspirin 81 MG EC tablet Take 1 tablet (81 mg total) by mouth daily. Start taking on: July 03, 2019   atorvastatin 40 MG tablet Commonly known as: LIPITOR Take 1 tablet (40 mg total) by mouth daily at 6 PM.   guaiFENesin-dextromethorphan 100-10 MG/5ML syrup Commonly known as: ROBITUSSIN DM Take 5 mLs by mouth every 4 (four) hours as needed for cough.   insulin NPH Human 100 UNIT/ML injection Commonly known as: NOVOLIN N Inject 40-44 Units into the skin See admin instructions. Inject 44 units subcutaneously every morning and 40 units at night   sitaGLIPtin-metformin 50-1000 MG tablet Commonly known as: JANUMET Take 1 tablet by mouth 2 (two) times daily with a meal.   zinc sulfate 220 (50 Zn) MG capsule Take 1 capsule (220 mg total) by mouth daily. Start taking on: July 03, 2019            Durable Medical Equipment  (From admission, onward)         Start     Ordered   06/27/19 0948  For home use only DME 3 n 1  Once     06/27/19 0947          Discharge Assessment: Vitals:   07/01/19 2218 07/02/19 0539  BP: 121/63 138/83  Pulse: 73 75  Resp: 16 16  Temp: 98.4 F (36.9 C) 98.1 F (36.7 C)  SpO2: (!) 89% 91%   Skin clean, dry and intact without evidence of skin break down, no  evidence of skin tears noted. IV catheter discontinued intact. Site without signs and symptoms of complications - no redness or edema noted at insertion site, patient denies c/o pain - only slight tenderness at site.  Dressing with slight pressure applied.  D/c Instructions-Education: Discharge instructions given to patient/family with verbalized understanding. D/c education completed with patient/family including follow up instructions, medication list, d/c activities limitations if indicated, with other d/c instructions as indicated by MD - patient able to verbalize understanding, all questions fully answered. Patient instructed to return to ED, call 911, or call MD for any changes in condition.  Patient escorted via [place], and D/C home via private auto.  Merlyn Lot, RN 07/02/2019 12:23 PM

## 2019-07-02 NOTE — TOC Transition Note (Signed)
Transition of Care [facility]) - CM/SW Discharge Note   Patient Details  Name: Stephanie [name] MRN: [id number] Date of Birth: 08-26-1960  Transition of Care [facility]) CM/SW Contact:  Bartholomew Crews, RN Phone Number: [phone] 07/02/2019, 12:19 PM   Clinical Narrative:    Spoke with patient on phone using language line interpreter. Discussed that her medications were sent to the [facility] pharmacy in [place] - patient states that this was fine except that pharmacy closes at noon today and does not open again until Monday.   Spoke with [facility] pharmacy - OTC meds can be filled for $15 and inhaler for $55, patient opted for OTC meds but states that she has an inhaler at home, so this can wait until Monday and pick up inhaler at the clinic pharmacy.   Patient stated that her daughter, Stephanie [name] [phone] can pick up medications and she will be picking up patient to transport home. [place] pharmacy to call daughter to make arrangements for delivery of medications. TOC to reach out to MD for prescriptions.   Unable to get [place] d/t patient does not have a [employer] manager number. Patient states that her daughter and sister are available to assist her. Discussed to stay mobile and take frequent breaks to work on her endurance. Patient verbalized understanding.   Daughter to provide transportation home.   Spoke to [employer] in [place]. Advised of patient discharging home. DC summary faxed to clinic at [phone]. Patient advised to schedule an appointment with her clinic. Patient verbalized understanding.   Nursing to ambulate patient on RA prior to discharge to ensure patient able maintain oxygenation saturation vs needing to discharge with oxygen. Noted progress note that patient dropped to 89% on RA but quickly rebounded to 93%. No home O2 needed.   TOC following for transition needs. Patient to transition home today.    Final next level of care: Home/Self Care Barriers to  Discharge: No Barriers Identified   Patient Goals and CMS Choice Patient states their goals for this hospitalization and ongoing recovery are:: home with family CMS Medicare.gov Compare Post Acute Care list provided to:: Patient    Discharge Placement                       Discharge Plan and Services   Discharge Planning Services: CM Consult Post Acute Care Choice: Home Health, Durable Medical Equipment          DME Arranged: Oxygen(through [facility]) DME Agency: [employer] Date DME Agency Contacted: 06/30/19   Representative spoke with at DME Agency: Zack            Social Determinants of Health ([place]) Interventions     Readmission Risk Interventions No flowsheet data found.

## 2019-07-02 NOTE — Discharge Planning (Signed)
Ambulated patient without O2 sat 89% but immediately returned to 93% upon coughing. VSS

## 2019-07-02 NOTE — Discharge Instructions (Addendum)
You need to quarantine till January 9th 2021   COVID-19: Cmo protegerse y proteger a los dems COVID-19: How to Protect Yourself and Others Sepa cmo se propaga  Actualmente, no existe ninguna vacuna para prevenir la enfermedad por coronavirus 2019 (COVID-19).  La mejor forma de prevenir la enfermedad es evitar exponerse a este virus.  Se cree que el virus se transmite principalmente de [place] persona a [place]. ? [employer] Products and Chemicals que estn en contacto directo entre s (a una distancia inferior a 6 pies [1.70m]). ? A travs de las gotitas respiratorias producidas cuando una persona infectada tose, estornuda o habla. ? Estas gotitas pueden caer en la boca o en la nariz de las personas que estn cerca o pueden ser [employer] pulmones. ? El COVID-19 puede ser transmitido por personas que no presentan sntomas. Lo que todos deben hacer Public Service Enterprise Group las manos con frecuencia  Lvese las manos con frecuencia con agua y jabn durante al menos 20 segundos, especialmente despus de Oceanographer en un lugar pblico o despus de sonarse la nariz, toser o estornudar.  Si no dispone de [place] y [place], use un desinfectante de manos que contenga al menos un 60% de alcohol. Cubra todas las superficies de las manos y frtelas hasta que se sientan secas.  No se toque los ojos, la nariz y la boca sin antes lavarse las manos. Evite el contacto cercano  Limite el contacto directo con otras personas tanto como sea posible.  Evite el contacto cercano con personas que estn enfermas.  Establezca distancia entre usted y [employer]. ? Recuerde que Mic[employer] no tienen sntomas pueden transmitir el virus. ? Esto es Software engineer para las personas que tienen ms riesgo de [email or web address] Cbrase la boca y la nariz con una mascarilla cuando est cerca de otras personas  Puede transmitir el COVID-19 a otras  personas aunque no se sienta enfermo.  Todas las Statistician en lugares pblicos y cuando estn con otras que no vivan en su casa, especialmente cuando el distanciamiento social sea difcil de [place]. ? Las mascarillas no deben colocarse a nios menores de 2 aos de [place], a las personas que tienen problemas respiratorios o que estn inconscientes, incapacitadas o que por algn motivo no puedan quitarse la mascarilla sin [place].  El propsito de Nurse, adult es proteger a Economist en caso de que usted est infectado.  NO utilice las [employer] a los trabajadores de [employer].  Contine manteniendo una distancia aproximada de 6 pies (1.16m) entre usted y [employer]. La mascarilla no reemplaza el distanciamiento social. Cbrase al toser y estornudar  Al toser o al estornudar, siempre cbrase la boca y la nariz con un pauelo descartable o use el interior del codo.  Deseche los pauelos descartables usados en la basura.  Inmediatamente, lvese las manos con agua y [place] durante al menos 20segundos. Si no dispone de agua y [place], [employer] con un desinfectante de manos que contenga al menos un 60% de alcohol. Limpie y desinfecte  Limpie Y desinfecte las superficies que se tocan con frecuencia todos los [address]. Esto incluye mesas, picaportes, interruptores de luz, encimeras, mangos, escritorios, telfonos, teclados, inodoros, grifos y lavabos. [email or web address]  Si las superficies estn sucias, lmpielas: Use detergente o jabn y agua antes de la desinfeccin.  Luego, use un desinfectante domstico. Programmer, applications de los desinfectantes domsticos registrados en Financial controller (EPA) (Agencia de Proteccin Ambiental) aqu. [email or web address] 03/04/2019 Esta  informacin no tiene Marine scientist el consejo del mdico. Asegrese de hacerle al mdico  cualquier pregunta que tenga. Document Revised: 03/18/2019 Document Reviewed: 01/09/2019 Elsevier Patient Education  [place]. COVID-19 COVID-19 El COVID-19 es una infeccin respiratoria causada por un virus llamado coronavirus tipo 2 causante del sndrome respiratorio agudo grave (SARS-CoV-2). La enfermedad tambin se conoce como enfermedad por coronavirus o nuevo coronavirus. En algunas personas, el virus puede no ocasionar sntomas. En otras, puede producir una infeccin grave. La infeccin puede empeorar rpidamente y causar complicaciones, como:  Neumona o infeccin en los pulmones.  Sndrome de dificultad respiratoria aguda o SDRA. Es una afeccin que se caracteriza por la acumulacin de lquido en los pulmones, que impide que los pulmones se llenen de aire y pasen oxgeno a Herbalist.  Insuficiencia respiratoria aguda. Es una afeccin que se caracteriza porque no pasa suficiente oxgeno de los pulmones al cuerpo o porque el dixido de carbono no pasa de los pulmones hacia afuera del cuerpo.  Sepsis o choque sptico. Se trata de una reaccin grave del cuerpo ante una infeccin.  Problemas de coagulacin.  Infecciones secundarias debido a bacterias u hongos.  Falla de rganos. Ocurre cuando los rganos del cuerpo dejan de funcionar. El virus que causa el COVID-19 es contagioso. Esto significa que puede transmitirse de [place] persona a otra a travs de las gotitas de saliva de la tos y de los estornudos (secreciones respiratorias). Cules son las causas? Esta enfermedad es causada por un virus. Usted puede contagiarse con este virus:  Al inspirar las gotitas de una persona infectada. Las [employer] pueden diseminarse cuando una persona respira, habla, canta, tose o estornuda.  Al tocar algo, como una mesa o el picaportes de [place], que estuvo expuesto al virus (contaminado) y luego tocarse la boca, nariz o los ojos. Qu incrementa el riesgo? Riesgo de infeccin Es ms  probable que se infecte con este virus si:  Se encuentra a Physiological scientist a 6 pies (2 metros) de Arts administrator con COVID-19.  Cuida o vive con una persona infectada con COVID-19.  Pasa tiempo en espacios interiores repletos de gente o vive en viviendas compartidas. Riesgo de enfermedad grave Es ms probable que se enferme gravemente por el virus si:  Tiene 50aos o ms. Cuanto mayor sea su edad, mayor ser el riesgo de tener una enfermedad grave.  Vive en un hogar de ancianos o centro de atencin a Barrister's clerk.  Tiene cncer.  Tiene una enfermedad prolongada (crnica), como las siguientes: ? Enfermedad pulmonar crnica, que incluye la enfermedad pulmonar obstructiva crnica o asma. ? Una enfermedad crnica que disminuye la capacidad del cuerpo para combatir las infecciones (immunocomprometido). ? Enfermedad cardaca, que incluye insuficiencia cardaca, una afeccin que se caracteriza porque las arterias que llegan al corazn se Investment banker, corporate u obstruyen (arteriopata coronaria) o una enfermedad que provoca que el msculo cardaco se engrose, se debilite o endurezca (miocardiopata). ? Diabetes. ? Enfermedad renal crnica. ? Anemia drepanoctica, una enfermedad que se caracteriza porque los glbulos rojos tienen una forma anormal de "hoz". ? Enfermedad heptica.  Es obeso. Cules son los signos o sntomas? Los sntomas de esta afeccin pueden ser de leves a graves. Los sntomas pueden aparecer en el trmino de 2 a [address] despus de haber estado expuesto al virus. Incluyen los siguientes:  Fiebre o escalofros.  Tos.  Dificultad para respirar.  Dolores de Nectar, dolores en el cuerpo o dolores musculares.  Secrecin o congestin nasal.  Dolor de  garganta.  Nueva prdida del sentido del gusto o del olfato. Algunas personas tambin pueden Matteltener problemas estomacales, como nuseas, vmitos o diarrea. Es posible que otras personas no tengan sntomas de COVID-19. Cmo se  diagnostica? Esta afeccin se puede diagnosticar en funcin de lo siguiente:  Sus signos y sntomas, especialmente si: ? Vive en una zona donde hay un brote de COVID-19. ? Viaj recientemente a una zona donde el virus es frecuente. ? Cuida o vive con Neomia Dearuna persona a quien se le diagnostic COVID-19. ? Odelia Gagestuvo expuesto a una persona a la que se le diagnostic COVID-19.  Un examen fsico.  Anlisis de laboratorio que pueden incluir: ? Tomar una muestra de lquido de la parte posterior de la nariz y la garganta (lquido nasofarngeo), la nariz o la garganta, con un hisopo. ? Una muestra de mucosidad de los pulmones (esputo). ? Anlisis de [place].  Los estudios de diagnstico por imgenes pueden incluir radiografas, exploracin por tomografa computarizada (TC) o ecografa. Cmo se trata? En este momento, no hay ningn medicamento para tratar el COVID-19. Los medicamentos para tratar otras enfermedades se usan a modo de ensayo para comprobar si son eficaces contra el COVID-19. El mdico le informar sobre las maneras de tratar los sntomas. En la Franklin Resourcesmayora de las personas, la infeccin es leve y puede controlarse en el hogar con reposo, lquidos y medicamentos de Elm Springsventa libre. El tratamiento para una infeccin grave suele realizarse en la unidad de cuidados intensivos (UCI) de un hospital. Puede incluir uno o ms de los siguientes. Estos tratamientos se administran hasta que los sntomas mejoran.  Recibir lquidos y United Parcelmedicamentos a travs de una va intravenosa.  Oxgeno complementario. Para administrar oxgeno extra, se [place] ([place]) Islandsutiliza un tubo en la Darene Lamernariz, una mascarilla o una campana de oxgeno.  Colocarlo para que se recueste boca abajo (decbito prono). Esto facilita el ingreso de oxgeno a los pulmones.  Uso continuo de Comorosuna mquina de presin positiva de las vas areas (CPAP) o de presin positiva de las vas areas de dos niveles (BPAP). Este tratamiento utiliza una presin de aire leve para  Pharmacologistmantener las vas respiratorias abiertas. Un tubo conectado a un motor administra oxgeno al cuerpo.  Respirador. Este tratamiento mueve el aire dentro y fuera de los pulmones mediante el uso de un tubo que se coloca en la trquea.  Traqueostoma. En este procedimiento se hace un orificio en el cuello para insertar un tubo de respiracin.  Oxigenacin por membrana extracorprea (OMEC). En este procedimiento, los pulmones tienen la posibilidad de recuperarse al asumir las funciones del corazn y los pulmones. Suministra oxgeno al cuerpo y elimina el dixido de carbono. Siga estas instrucciones en su casa: Estilo de vida  Si est enfermo, qudese en su casa, excepto para obtener atencin mdica. El mdico le indicar cunto tiempo debe quedarse en casa. Llame al mdico antes de buscar atencin mdica.  Haga reposo en su casa como se lo haya indicado el mdico.  No consuma ningn producto que contenga nicotina o tabaco, como cigarrillos, cigarrillos electrnicos y tabaco de Theatre managermascar. Si necesita ayuda para dejar de fumar, consulte al mdico.  Retome sus actividades normales segn lo indicado por el mdico. Pregntele al mdico qu actividades son seguras para usted. Instrucciones generales  Use los medicamentos de venta libre y los recetados solamente como se lo haya indicado el mdico.  Beba suficiente lquido como para Pharmacologistmantener la orina de color amarillo plido.  Concurra a todas las visitas de seguimiento como se lo haya indicado  el mdico. Esto es importante. Cmo se evita?  No hay ninguna vacuna que ayude a prevenir la infeccin por COVID-19. Sin embargo, hay medidas que puede tomar para protegerse y Conservator, museum/gallery a Economist de este virus. Para protegerse:   No viaje a zonas donde el COVID-19 sea un riesgo. Las zonas donde se informa la presencia del COVID-19 cambian con frecuencia. Para identificar las zonas de alto riesgo y las restricciones de viaje, consulte el sitio web de viajes  de Building control surveyor for [employer] Technology and Prevention Insurance claims handler) (Centros para el Control y la Prevencin de Event organiser): [email or web address].si  Si vive o debe viajar a una zona donde el COVID-19 es un riesgo, tome precauciones para evitar infecciones. ? Aljese de [employer]el[employer]. ? Lvese las manos frecuentemente con agua y [place]. Use desinfectante para manos con alcohol si no dispone de [place] y [place]. ? Evite tocarse la boca, la cara, los ojos o la [place]. ? Evite salir de su casa, siga las indicaciones de su estado y de las autoridades sanitarias locales. ? Si debe salir de su casa, use un barbijo de tela o una mascarilla facial. Asegrese de que le cubra la nariz y la boca. ? Evite los espacios interiores repletos de gente. Mantenga una distancia de al menos 6 pies (2 metros) de Economist. ? Desinfecte los objetos y las superficies que se tocan con frecuencia todos [place]. Pueden incluir:  Encimeras y [place].  Picaportes e interruptores de luz.  Lavabos, fregaderos y grifos.  Aparatos electrnicos tales como telfonos, controles remotos, teclados, computadoras y tabletas. Cmo proteger a los dems: Si tiene sntomas de COVID-19, tome medidas para evitar que el virus se propague a Economist.  Si cree que tiene una infeccin por COVID-19, comunquese de inmediato con su mdico. Informe al equipo de atencin mdica que cree que puede tener una infeccin por el COVID-19.  Qudese en su casa. Salga de su casa solo para buscar atencin mdica. No utilice el transporte pblico.  No viaje mientras est enfermo.  Lvese las manos frecuentemente con agua y [place]. Usar desinfectante para manos con alcohol si no dispone de [place] y [place].  Mantngase alejado de quienes vivan con usted. Permita que los miembros de la familia sanos cuiden a los nios y las [place], si es posible. Si tiene que cuidar a los nios o las mascotas, lvese las manos  con frecuencia y use un barbijo. Si es posible, permanezca en su habitacin, separado de los dems. Utilice un bao diferente.  Asegrese de que todas las personas que viven en su casa se laven bien las manos y con frecuencia.  Tosa o estornude en un pauelo de papel o sobre su manga o codo. No tosa o estornude al aire ni se cubra la boca o la nariz con la [place].  Use un barbijo de tela o una mascarilla facial. Asegrese de que le cubra la nariz y la boca. Dnde buscar ms informacin  Centers for Disease Control and Prevention (Centros para el Control y la Prevencin de Event organiser): StickerEmporium.tn  World Health Organization (Organizacin Mundial de la Salud): [email or web address]/ Comunquese con un mdico si:  Vive o ha viajado a una zona donde el COVID-19 es un riesgo y tiene sntomas de infeccin.  Ha tenido contacto con alguien que tiene COVID-19 y usted tiene sntomas de infeccin. Solicite ayuda inmediatamente si:  Tiene dificultad para respirar.  Siente dolor u opresin en el pecho.  Experimenta confusin.  Tiene  las uas de los dedos y los labios de color [place].  Tiene dificultad para despertarse.  Los sntomas empeoran. Estos sntomas pueden representar un problema grave que constituye Radio broadcast assistant. No espere a ver si los sntomas desaparecen. Solicite atencin mdica de inmediato. Comunquese con el servicio de emergencias de su localidad (911 en los [place]). No conduzca por sus propios medios [employer] hospital. Informe al personal mdico de emergencias si cree que tiene COVID-19. Resumen  El COVID-19 es una infeccin respiratoria causada por un virus. Tambin se conoce como enfermedad por coronavirus o nuevo coronavirus. Puede causar infecciones graves, como neumona, sndrome de dificultad respiratoria aguda, insuficiencia respiratoria aguda o sepsis.  El virus que causa el COVID-19 es contagioso. Esto  significa que puede transmitirse de [place] persona a otra a travs de las gotitas que se despiden al respirar, Heritage manager, cantar, toser y Engineering geologist.  Es ms probable que desarrolle una enfermedad grave si tiene 50 aos o ms, tiene el sistema inmunitario dbil, vive en un hogar de ancianos o tiene una enfermedad crnica.  No hay ningn medicamento para tratar el COVID-19. El mdico le informar sobre las maneras de tratar los sntomas.  Tome medidas para protegerse y Conservator, museum/gallery a los Merchandiser, retail las infecciones. Lvese las manos con frecuencia y desinfecte los objetos y las superficies que se tocan con frecuencia todos [place]. Mantngase alejado de las personas que estn enfermas y use un barbijo si est enfermo. Esta informacin no tiene Theme park manager el consejo del mdico. Asegrese de hacerle al mdico cualquier pregunta que tenga. Document Revised: 04/23/2019 Document Reviewed: 08/16/2018 Elsevier Patient Education  2020 [employer].

## 2019-07-02 NOTE — TOC Transition Note (Signed)
Transition of Care [facility]) - CM/SW Discharge Note   Patient Details  Name: Stephanie [name] MRN: [id number] Date of Birth: 03/11/1961  [facility] CM/SW Contact:  Bartholomew Crews, RN Phone Number: [phone] 07/02/2019, 1:26 PM   Clinical Narrative:    [facility] pharmacy able to fill prescriptions. Daughter paid for and received the medications. No further TOC needs identified at this time.    Final next level of care: Home/Self Care Barriers to Discharge: No Barriers Identified   Patient Goals and CMS Choice Patient states their goals for this hospitalization and ongoing recovery are:: home with family CMS Medicare.gov Compare Post Acute Care list provided to:: Patient    Discharge Placement                       Discharge Plan and Services   Discharge Planning Services: CM Consult Post Acute Care Choice: Home Health, Durable Medical Equipment          DME Arranged: Oxygen(through LOG) DME Agency: [employer] Date DME Agency Contacted: 06/30/19   Representative spoke with at DME Agency: Zack            Social Determinants of Health ([place]) Interventions     Readmission Risk Interventions No flowsheet data found.

## 2019-07-02 NOTE — Discharge Summary (Addendum)
Physician Discharge Summary  Stephanie [name] VFI:[id number] DOB: 1961-06-21 DOA: 06/23/2019  PCP: Patient, No Pcp Per  Admit date: 06/23/2019 Discharge date: 07/02/2019  Time spent: 50* minutes  Recommendations for Outpatient Follow-up:  1. You need to quarantine till July 11, 2019 2. Follow-up cardiology in 6 weeks on 08/10/2018. 3. Echocardiogram has been set up on 08/05/2018. 4. Follow-up PCP, televisit scheduled on 07/07/2019.  Discharge Diagnoses:  Principal Problem:   Pneumonia due to COVID-19 virus Active Problems:   Type 2 diabetes mellitus without complication, with long-term current use of insulin (HCC)   Morbid obesity ([place])   Discharge Condition: Stable  Diet recommendation: Heart healthy diet  Filed Weights   06/24/19 0230 06/26/19 0314 06/27/19 0429  Weight: 123 kg 119 kg 118.8 kg    History of present illness:  58 year old female with a history of type 2 diabetes mellitus, hypertension, morbid obesity who was transferred from [facility] diagnosis of multifocal pneumonia due to SARS COVID-19.  She had tested positive for SARS COVID-19 on December 19, her symptom consisted of headache, diarrhea, loss of sense of smell and taste, fever, progressive/worsening dyspnea.  She was transferred to [facility] on December 23 for further management.  Chest x-ray showed bilateral interstitial infiltrates.  She was started on remdesivir and Decadron  Hospital Course:   1. Acute hypoxic respiratory failure-due to SARS COVID-19 pneumonia.  Patient was started on remdesivir and Decadron and has completed 5 days of remdesivir in the hospital.  Her inflammatory markers show significant improvement.    Patient was still requiring 4 L/min of oxygen, significantly improved after starting incentive spirometry and out of bed to chair.  She is currently off oxygen and her O2 sats are in the range of 90 to 91% on room air.  Will not discharge on oxygen, she is a  fall risk as per physical therapy. She was started on IV Lasix 40 mg every hour for possible underlying CHF but no improvement in breathing.  Echocardiogram shows EF 45 to 50% with global hypokinesis.  CT chest was obtained which is negative for pulmonary embolism.  Continues to show bilateral groundglass opacities consistent with multifocal pneumonia.     Patient has completed 9 days of treatment with Decadron and 5 days of treatment with remdesivir.  Would not discharge on Decadron due to elevated blood glucose.  Continue with vitamin C, zinc sulfate to 20 mg p.o. daily for 10 more days.  She will continue with incentive spirometry as outpatient.  2. Combined systolic  CHF-echocardiogram shows EF 45 to 50% with global hypokinesis.  Not sure if this is contributing to patient's ongoing dyspnea on exertion.  Cardiology was consulted, appreciate cardiology consultation.  Patient started on aspirin and atorvastatin as per cardiology recommendation.  She will follow up with cardiology as outpatient.  No intervention recommended at this time.    Will start on aspirin 81 mg daily, Lipitor 40 mg p.o. daily.  Would avoid beta-blocker due to ongoing shortness of breath.  She can follow-up with cardiology as outpatient and beta-blocker can be started at that time.  3. Diabetes mellitus type 2, uncontrolled due to steroid induced hyperglycemia-patient was started on sliding scale insulin with NovoLog, blood glucose has improved after increasing  Levemir to 50 units subcu twice daily, NovoLog 10 units subcu 3 times daily meal coverage.   Procedures:    Consultations:  Cardiology  Discharge Exam: Vitals:   07/01/19 2218 07/02/19 0539  BP: 121/63 138/83  Pulse: 73 75  Resp: 16 16  Temp: 98.4 F (36.9 C) 98.1 F (36.7 C)  SpO2: (!) 89% 91%    General: Appears in no acute distress Cardiovascular: S1-S2, regular Respiratory: Clear to auscultation bilaterally  Discharge Instructions   Discharge  Instructions    Diet - low sodium heart healthy   Complete by: As directed    Discharge instructions   Complete by: As directed    Continue quarantine for 2 weeks, use a mask in public and maintain physical distancing.   Discharge instructions   Complete by: As directed    Person Under Monitoring Name: Jesalyn Finazzo  Location: [address] [place] [address]   Infection Prevention Recommendations for Individuals Confirmed to have, or Being Evaluated for, 2019 Novel Coronavirus (COVID-19) Infection Who Receive Care at Home  Individuals who are confirmed to have, or are being evaluated for, COVID-19 should follow the prevention steps below until a healthcare provider or local or state health department says they can return to normal activities.  Stay home except to get medical care You should restrict activities outside your home, except for getting medical care. Do not go to work, school, or public areas, and do not use public transportation or taxis.  Call ahead before visiting your doctor Before your medical appointment, call the healthcare provider and tell them that you have, or are being evaluated for, COVID-19 infection. This will help the healthcare provider's office take steps to keep other people from getting infected. Ask your healthcare provider to call the local or state health department.  Monitor your symptoms Seek prompt medical attention if your illness is worsening (e.g., difficulty breathing). Before going to your medical appointment, call the healthcare provider and tell them that you have, or are being evaluated for, COVID-19 infection. Ask your healthcare provider to call the local or state health department.  Wear a facemask You should wear a facemask that covers your nose and mouth when you are in the same room with other people and when you visit a healthcare provider. People who live with or visit you should also wear a facemask while they  are in the same room with you.  Separate yourself from other people in your home As much as possible, you should stay in a different room from other people in your home. Also, you should use a separate bathroom, if available.  Avoid sharing household items You should not share dishes, drinking glasses, cups, eating utensils, towels, bedding, or other items with other people in your home. After using these items, you should wash them thoroughly with soap and water.  Cover your coughs and sneezes Cover your mouth and nose with a tissue when you cough or sneeze, or you can cough or sneeze into your sleeve. Throw used tissues in a lined trash can, and immediately wash your hands with soap and water for at least 20 seconds or use an alcohol-based hand rub.  Wash your [employer] your hands often and thoroughly with soap and water for at least 20 seconds. You can use an alcohol-based hand sanitizer if soap and water are not available and if your hands are not visibly dirty. Avoid touching your eyes, nose, and mouth with unwashed hands.   Prevention Steps for Caregivers and Household Members of Individuals Confirmed to have, or Being Evaluated for, COVID-19 Infection Being Cared for in the Home  If you live with, or provide care at home for, a person confirmed to have, or  being evaluated for, COVID-19 infection please follow these guidelines to prevent infection:  Follow healthcare provider's instructions Make sure that you understand and can help the patient follow any healthcare provider instructions for all care.  Provide for the patient's basic needs You should help the patient with basic needs in the home and provide support for getting groceries, prescriptions, and other personal needs.  Monitor the patient's symptoms If they are getting sicker, call his or her medical provider and tell them that the patient has, or is being evaluated for, COVID-19 infection. This will help the  healthcare provider's office take steps to keep other people from getting infected. Ask the healthcare provider to call the local or state health department.  Limit the number of people who have contact with the patient If possible, have only one caregiver for the patient. Other household members should stay in another home or place of residence. If this is not possible, they should stay in another room, or be separated from the patient as much as possible. Use a separate bathroom, if available. Restrict visitors who do not have an essential need to be in the home.  Keep older adults, very young children, and other sick people away from the patient Keep older adults, very young children, and those who have compromised immune systems or chronic health conditions away from the patient. This includes people with chronic heart, lung, or kidney conditions, diabetes, and cancer.  Ensure good ventilation Make sure that shared spaces in the home have good air flow, such as from an air conditioner or an opened window, weather permitting.  Wash your hands often Wash your hands often and thoroughly with soap and water for at least 20 seconds. You can use an alcohol based hand sanitizer if soap and water are not available and if your hands are not visibly dirty. Avoid touching your eyes, nose, and mouth with unwashed hands. Use disposable paper towels to dry your hands. If not available, use dedicated cloth towels and replace them when they become wet.  Wear a facemask and gloves Wear a disposable facemask at all times in the room and gloves when you touch or have contact with the patient's blood, body fluids, and/or secretions or excretions, such as sweat, saliva, sputum, nasal mucus, vomit, urine, or feces.  Ensure the mask fits over your nose and mouth tightly, and do not touch it during use. Throw out disposable facemasks and gloves after using them. Do not reuse. Wash your hands immediately after  removing your facemask and gloves. If your personal clothing becomes contaminated, carefully remove clothing and launder. Wash your hands after handling contaminated clothing. Place all used disposable facemasks, gloves, and other waste in a lined container before disposing them with other household waste. Remove gloves and wash your hands immediately after handling these items.  Do not share dishes, glasses, or other household items with the patient Avoid sharing household items. You should not share dishes, drinking glasses, cups, eating utensils, towels, bedding, or other items with a patient who is confirmed to have, or being evaluated for, COVID-19 infection. After the person uses these items, you should wash them thoroughly with soap and water.  Wash laundry thoroughly Immediately remove and wash clothes or bedding that have blood, body fluids, and/or secretions or excretions, such as sweat, saliva, sputum, nasal mucus, vomit, urine, or feces, on them. Wear gloves when handling laundry from the patient. Read and follow directions on labels of laundry or clothing items and detergent. In  general, wash and dry with the warmest temperatures recommended on the label.  Clean all areas the individual has used often Clean all touchable surfaces, such as counters, tabletops, doorknobs, bathroom fixtures, toilets, phones, keyboards, tablets, and bedside tables, every day. Also, clean any surfaces that may have blood, body fluids, and/or secretions or excretions on them. Wear gloves when cleaning surfaces the patient has come in contact with. Use a diluted bleach solution (e.g., dilute bleach with 1 part bleach and 10 parts water) or a household disinfectant with a label that says EPA-registered for coronaviruses. To make a bleach solution at home, add 1 tablespoon of bleach to 1 quart (4 cups) of water. For a larger supply, add  cup of bleach to 1 gallon (16 cups) of water. Read labels of cleaning  products and follow recommendations provided on product labels. Labels contain instructions for safe and effective use of the cleaning product including precautions you should take when applying the product, such as wearing gloves or eye protection and making sure you have good ventilation during use of the product. Remove gloves and wash hands immediately after cleaning.  Monitor yourself for signs and symptoms of illness Caregivers and household members are considered close contacts, should monitor their health, and will be asked to limit movement outside of the home to the extent possible. Follow the monitoring steps for close contacts listed on the symptom monitoring form.   ? If you have additional questions, contact your local health department or call the epidemiologist on call at [phone] (available 24/7). ? This guidance is subject to change. For the most up-to-date guidance from [facility], please refer to their website: TripMetro.huhttps://www.cdc.gov/coronavirus/2019-ncov/hcp/guidance-prevent-spread.html   Increase activity slowly   Complete by: As directed    Increase activity slowly   Complete by: As directed      Allergies as of 07/02/2019   No Known Allergies     Medication List    TAKE these medications   albuterol 108 (90 Base) MCG/ACT inhaler Commonly known as: VENTOLIN HFA Inhale 2 puffs into the lungs every 6 (six) hours as needed for wheezing or shortness of breath.   ascorbic acid 500 MG tablet Commonly known as: VITAMIN C Take 1 tablet (500 mg total) by mouth daily. Start taking on: July 03, 2019   aspirin 81 MG EC tablet Take 1 tablet (81 mg total) by mouth daily. Start taking on: July 03, 2019   atorvastatin 40 MG tablet Commonly known as: LIPITOR Take 1 tablet (40 mg total) by mouth daily at 6 PM.   guaiFENesin-dextromethorphan 100-10 MG/5ML syrup Commonly known as: ROBITUSSIN DM Take 5 mLs by mouth every 4 (four) hours as needed for cough.   insulin NPH  Human 100 UNIT/ML injection Commonly known as: NOVOLIN N Inject 40-44 Units into the skin See admin instructions. Inject 44 units subcutaneously every morning and 40 units at night   sitaGLIPtin-metformin 50-1000 MG tablet Commonly known as: JANUMET Take 1 tablet by mouth 2 (two) times daily with a meal.   zinc sulfate 220 (50 Zn) MG capsule Take 1 capsule (220 mg total) by mouth daily. Start taking on: July 03, 2019            Durable Medical Equipment  (From admission, onward)         Start     Ordered   06/27/19 0948  For home use only DME 3 n 1  Once     06/27/19 [phone] [id number]  No Known Allergies Follow-up Information    [facility] Follow up.   Why: Office will call you for virtual visit 07/07/19 at 1:30pm Contact information: [address], [address] [place] [address]       Parke Poisson, MD Follow up.   Specialties: Cardiology, Radiology Why: Cardiology hospital follow up on 08/11/2019 at 11:40. Please arrive 15 minutes early for check in.  Contact information: [address] [place] [address] [phone]        [facility] [address] Office Follow up.   Specialty: Cardiology Why: Echocardiogram scheduled for 08/06/2019 at 10:15 at our [facility] office. Results will be reviewed at your follow up with Dr. Jacques Navy.  Contact information: [address], [address] [place] [address] [phone]           The results of significant diagnostics from this hospitalization (including imaging, microbiology, ancillary and laboratory) are listed below for reference.    Significant Diagnostic Studies: DG Chest 1 View  Result Date: 06/24/2019 CLINICAL DATA:  Dyspnea. Worsening cough. EXAM: CHEST  1 VIEW COMPARISON:  06/23/2019 FINDINGS: The cardiomediastinal silhouette is unchanged. Patchy airspace opacities are present throughout the mid and lower lungs bilaterally and have  mildly worsened. No sizable pleural effusion or pneumothorax is identified. IMPRESSION: Mild worsening of pneumonia. Electronically Signed   By: Sebastian Ache M.D.   On: 06/24/2019 09:58   CT ANGIO CHEST PE W OR WO CONTRAST  Result Date: 06/30/2019 CLINICAL DATA:  Shortness of breath.  COVID pneumonia. EXAM: CT ANGIOGRAPHY CHEST WITH CONTRAST TECHNIQUE: Multidetector CT imaging of the chest was performed using the standard protocol during bolus administration of intravenous contrast. Multiplanar CT image reconstructions and MIPs were obtained to evaluate the vascular anatomy. CONTRAST:  OMNIPAQUE IOHEXOL 350 MG/ML SOLN COMPARISON:  Chest x-ray 06/28/2019 FINDINGS: Cardiovascular: The heart is normal in size. No pericardial effusion. The aorta is normal in caliber. No dissection. The branch vessels are patent. A few scattered coronary artery calcifications are noted. The pulmonary arterial tree is fairly well opacified. No definite filling defects to suggest pulmonary embolism. Mediastinum/Nodes: Scattered borderline mediastinal and hilar lymph nodes likely inflammatory/hyperplastic. The esophagus is grossly normal. Lungs/Pleura: Extensive bilateral ground-glass type infiltrates in both lungs consistent with COVID pneumonia. No pleural effusions. Upper Abdomen: Diffuse and fairly marked fatty infiltration of the liver but no focal hepatic lesions. No adrenal gland lesions. The gallbladder is surgically absent. Musculoskeletal: No breast masses, supraclavicular or axillary adenopathy. The thyroid gland is grossly normal. No significant bony findings. Review of the MIP images confirms the above findings. IMPRESSION: 1. Extensive bilateral ground-glass type infiltrates consistent with COVID pneumonia. 2. No CT findings for pulmonary embolism. 3. Normal thoracic aorta. Aortic Atherosclerosis (ICD10-I70.0). Electronically Signed   By: Rudie Meyer M.D.   On: 06/30/2019 15:45   CXR Portable same day  Result  Date: 06/28/2019 CLINICAL DATA:  Respiratory failure.  COVID-19. EXAM: PORTABLE CHEST 1 VIEW COMPARISON:  06/24/2019 FINDINGS: 1058 hours. Low lung volumes. Slight interval decrease in patchy ill-defined bilateral airspace disease right slightly more than left. No pleural effusion. Cardiopericardial silhouette is at upper limits of normal for size. The visualized bony structures of the thorax are intact. IMPRESSION: Asymmetric ill-defined patchy airspace disease in the mid and lower lungs appears slightly improved in the interval. Electronically Signed   By: Kennith Center M.D.   On: 06/28/2019 11:23   ECHOCARDIOGRAM LIMITED  Result Date: 06/29/2019  ECHOCARDIOGRAM LIMITED REPORT   Patient Name:   DELITHA ELMS [facility] Date of Exam: 06/29/2019 Medical Rec #:  [id number]                Height:       60.0 in Accession #:    [id number]               Weight:       261.9 lb Date of Birth:  [birth year]                 BSA:          2.09 m Patient Age:    58 years                 BP:           140/56 mmHg Patient Gender: F                        HR:           84 bpm. Exam Location:  Inpatient  Procedure: Limited Echo Indications:   428 CHF  History:       Patient has no prior history of Echocardiogram examinations.                Covid +.  Sonographer:   Celene Skeen RDCS (AE) Referring      [address] Analee Montee Phys:  Sonographer Comments: Image acquisition challenging due to patient body habitus. off axis apical windows. restricted mobility IMPRESSIONS  1. Left ventricular ejection fraction, by visual estimation, is 45 to 50%.  2. Definity contrast agent was given IV to delineate the left ventricular endocardial borders.  3. The left ventricle demonstrates global hypokinesis.  4. Very technically limited study - unable to comment on valves FINDINGS  Left Ventricle: Left ventricular ejection fraction, by visual estimation, is 45 to 50%. Definity contrast agent was given IV to delineate the left ventricular endocardial  borders. The left ventricle demonstrates global hypokinesis. Pericardium: There is no evidence of pericardial effusion is seen. There is no evidence of pericardial effusion.  Zoila Shutter MD Electronically signed by Zoila Shutter MD Signature Date/Time: 06/29/2019/4:05:53 PM   Final     Microbiology: No results found for this or any previous visit (from the past 240 hour(s)).   Labs: Basic Metabolic Panel: Recent Labs  Lab 06/26/19 0309 06/27/19 0500 06/29/19 0930 06/30/19 0419 07/01/19 1446 07/01/19 1959  NA 140 141 136 140 133*  --   K 4.4 4.3 3.6 4.0 4.7  --   CL 106 109 100 102 99  --   CO2 --   GLUCOSE 327* 322* 315* 268* 440* 384*  BUN 23* 21* 23* 26* 28*  --   CREATININE 0.75 0.75 0.77 0.86 0.91  --   CALCIUM 8.6* 8.2* 8.5* 8.6* 8.5*  --    Liver Function Tests: Recent Labs  Lab 06/26/19 0309 06/27/19 0500  AST 83* 56*  ALT 101* 112*  ALKPHOS 139* 123  BILITOT <0.1* 0.3  PROT 5.9* 5.8*  ALBUMIN 2.7* 2.6*   No results for input(s): LIPASE, AMYLASE in the last 168 hours. No results for input(s): AMMONIA in the last 168 hours. CBC: No results for input(s): WBC, NEUTROABS, HGB, HCT, MCV, PLT in the last 168 hours. Cardiac Enzymes: Recent Labs  Lab 06/30/19 1441  CKTOTAL 110   BNP: BNP (last 3 results) Recent Labs  06/30/19 1033  BNP 16.3    ProBNP (last 3 results) No results for input(s): PROBNP in the last 8760 hours.  CBG: Recent Labs  Lab 07/01/19 1659 07/01/19 1712 07/01/19 1852 07/01/19 2210 07/02/19 0739  GLUCAP 443* 439* 421* 333* 228*       Signed:  Meredeth Ide MD.  [facility] 07/02/2019, 10:29 AM

## 2019-07-07 ENCOUNTER — Ambulatory Visit (INDEPENDENT_AMBULATORY_CARE_PROVIDER_SITE_OTHER): Payer: HRSA Program | Admitting: Internal Medicine

## 2019-07-07 DIAGNOSIS — Z09 Encounter for follow-up examination after completed treatment for conditions other than malignant neoplasm: Secondary | ICD-10-CM

## 2019-07-07 DIAGNOSIS — J1282 Pneumonia due to coronavirus disease 2019: Secondary | ICD-10-CM

## 2019-07-07 DIAGNOSIS — I5020 Unspecified systolic (congestive) heart failure: Secondary | ICD-10-CM | POA: Diagnosis not present

## 2019-07-07 DIAGNOSIS — R7401 Elevation of levels of liver transaminase levels: Secondary | ICD-10-CM | POA: Diagnosis not present

## 2019-07-07 DIAGNOSIS — U071 COVID-19: Secondary | ICD-10-CM | POA: Diagnosis not present

## 2019-07-07 DIAGNOSIS — E119 Type 2 diabetes mellitus without complications: Secondary | ICD-10-CM

## 2019-07-07 DIAGNOSIS — Z794 Long term (current) use of insulin: Secondary | ICD-10-CM

## 2019-07-07 MED ORDER — INSULIN NPH (HUMAN) (ISOPHANE) 100 UNIT/ML ~~LOC~~ SUSP: SUBCUTANEOUS | 3 refills | Status: DC

## 2019-07-07 NOTE — Progress Notes (Signed)
Virtual Visit via Telephone Note Due to current restrictions/limitations of in-office visits due to the COVID-19 pandemic, this scheduled clinical appointment was converted to a telehealth visit I connected with Stephanie [name] at  1:30 PM EST by telephone and verified that I am speaking with the correct person using two identifiers.   I am in my office.  The patient is at home.  Only the patient, myself and Renaee Munda from [employer] [phone]) participated in this encounter.  I discussed the limitations, risks, security and privacy concerns of performing an evaluation and management service by telephone and the availability of in person appointments. I also discussed with the patient that there may be a patient responsible charge related to this service. The patient expressed understanding and agreed to proceed.   History of Present Illness: Pt with hx of DM type 2.  This is a new visit for hospital follow-up  Pt hosp 12/22-31/2020 with multifocal pneumonia secondary to COVID-19.  Patient was treated with steroids and remdesivir.  She was evaluated for possible underlying CHF distance and shortness of breath during hospitalization.  Echo revealed EF of 45-50% with global hypokinesis.  She was seen by cardiology.  CT of the chest was negative for pulmonary embolism.  She did have an elevation in liver enzymes mainly the AST and ALT.  She did not require oxygen at the time of discharge.  Today pt reports SOB better.  Pox 94-98% most of time on RA. Still has a mild cough.  No fever.    Systolic CHF: plan is for her to f/u with cardiologist.  Has appt with cardiologist 08/11/2019.  Pt also has repeat Echo scheduled a few days before that cardiology follow-up.  -Reports no LE edema.  DM:  Checking BS before breakfast and dinner.  Gives range BS before BF 179-215 and dinner 240s.   Confirms compliance with Janumet and Novolin 45 in a.m and 40 units p.m.  Prior to hosp she  was on Januvia, Metformin and insulin -she was followed regularly at a clinic in [facility] by Dr. Freddrick March.  Last seen there early December.  She plans to f/u with her PCP in 1-2 wks.   Current Outpatient Medications  Medication Instructions  . albuterol (VENTOLIN HFA) 108 (90 Base) MCG/ACT inhaler 2 puffs, Inhalation, Every 6 hours PRN  . ascorbic acid (VITAMIN C) 500 mg, Oral, Daily  . aspirin 81 mg, Oral, Daily  . atorvastatin (LIPITOR) 40 mg, Oral, Daily-1800  . guaiFENesin-dextromethorphan (ROBITUSSIN DM) 100-10 MG/5ML syrup 5 mLs, Oral, Every 4 hours PRN  . insulin NPH Human (NOVOLIN N) 40-44 Units, Subcutaneous, See admin instructions, Inject 44 units subcutaneously every morning and 40 units at night  . sitaGLIPtin-metformin (JANUMET) 50-1000 MG tablet 1 tablet, Oral, 2 times daily with meals  . zinc sulfate 220 mg, Oral, Daily      Observations/Objective:   Chemistry      Component Value Date/Time   NA 133 (L) 07/01/2019 1446   K 4.7 07/01/2019 1446   CL 99 07/01/2019 1446   CO2 22 07/01/2019 1446   BUN 28 (H) 07/01/2019 1446   CREATININE 0.91 07/01/2019 1446      Component Value Date/Time   CALCIUM 8.5 (L) 07/01/2019 1446   ALKPHOS 123 06/27/2019 0500   AST 56 (H) 06/27/2019 0500   ALT 112 (H) 06/27/2019 0500   BILITOT 0.3 06/27/2019 0500      Lab Results  Component Value Date   WBC 4.6 06/23/2019  HGB 12.4 06/23/2019   HCT 37.6 06/23/2019   MCV 84.5 06/23/2019   PLT 196 06/23/2019    Assessment and Plan: 1. Hospital discharge follow-up 2. Pneumonia due to COVID-19 virus Patient reports marked improvement in her symptoms. She plans to follow-up with her primary care doctor in [place].  I told her to have them recheck her liver enzymes as they were elevated during her hospitalization likely due to Covid for the remdesivir treatment.  She states that she will let her PCP know  3. Type 2 diabetes mellitus without complication, with long-term current use of  insulin (HCC) Not at goal.  Recommend increase Novolin and to 50 units in the morning and 45 units in the evening.  Encourage healthy eating habits - insulin NPH Human (NOVOLIN N) 100 UNIT/ML injection; Inject 50 units subcutaneously every morning and 45 units at night  Dispense: 20 mL; Refill: 3  4. Systolic congestive heart failure, unspecified HF chronicity (HCC) My CMA will call and give her the information of her upcoming appointment for echo and with the cardiologist next month.  Encouraged her to keep those appointments  5. Transaminitis See discussion under #1 above   Follow Up Instructions: Patient plans to follow-up with her previous PCP in [place]   I discussed the assessment and treatment plan with the patient. The patient was provided an opportunity to ask questions and all were answered. The patient agreed with the plan and demonstrated an understanding of the instructions.   The patient was advised to call back or seek an in-person evaluation if the symptoms worsen or if the condition fails to improve as anticipated.  I provided 25 minutes of non-face-to-face time during this encounter.   Jonah Blue, MD

## 2019-08-06 ENCOUNTER — Encounter (INDEPENDENT_AMBULATORY_CARE_PROVIDER_SITE_OTHER): Payer: Self-pay

## 2019-08-06 ENCOUNTER — Other Ambulatory Visit: Payer: Self-pay

## 2019-08-06 ENCOUNTER — Ambulatory Visit (HOSPITAL_COMMUNITY): Payer: Self-pay | Attending: Cardiology

## 2019-08-06 DIAGNOSIS — I429 Cardiomyopathy, unspecified: Secondary | ICD-10-CM | POA: Insufficient documentation

## 2019-08-06 MED ORDER — PERFLUTREN LIPID MICROSPHERE
1.0000 mL | INTRAVENOUS | Status: AC | PRN
  Administered 2019-08-06: 2 mL via INTRAVENOUS

## 2019-08-11 ENCOUNTER — Ambulatory Visit: Payer: Self-pay | Admitting: Internal Medicine

## 2019-09-09 ENCOUNTER — Other Ambulatory Visit: Payer: Self-pay

## 2019-09-09 ENCOUNTER — Ambulatory Visit (INDEPENDENT_AMBULATORY_CARE_PROVIDER_SITE_OTHER): Payer: Self-pay | Admitting: Internal Medicine

## 2019-09-09 VITALS — BP 138/80 | HR 106 | Temp 97.6°F | Ht 62.0 in | Wt 270.0 lb

## 2019-09-09 DIAGNOSIS — E119 Type 2 diabetes mellitus without complications: Secondary | ICD-10-CM

## 2019-09-09 DIAGNOSIS — I10 Essential (primary) hypertension: Secondary | ICD-10-CM

## 2019-09-09 DIAGNOSIS — I429 Cardiomyopathy, unspecified: Secondary | ICD-10-CM

## 2019-09-09 DIAGNOSIS — J1282 Pneumonia due to coronavirus disease 2019: Secondary | ICD-10-CM

## 2019-09-09 DIAGNOSIS — U071 COVID-19: Secondary | ICD-10-CM

## 2019-09-09 DIAGNOSIS — Z794 Long term (current) use of insulin: Secondary | ICD-10-CM

## 2019-09-09 DIAGNOSIS — R7401 Elevation of levels of liver transaminase levels: Secondary | ICD-10-CM

## 2019-09-09 NOTE — Progress Notes (Signed)
Cardiology Office Note:    Date:  09/09/2019   ID:  Stephanie [name], DOB Jan 11, 1961, MRN [id number]  PCP:  Patient, No Pcp Per  Cardiologist:  No primary care provider on file.  Electrophysiologist:  None   Referring MD: No ref. provider found   Chief Complaint: follow up hospitalization with reduced EF and COVID   This visit was conducted with the assistance of the in person interpreter service.  History of Present Illness:    Stephanie [name] is a 59 y.o. female with a hx of DM2, HTN, HLD, morbid obesity, Klebsiella cerebral abscess s/p craniotomy  who is being seen today for the evaluation of post hospital cardiovascular follow up after COVID 19 infection and reduced ejection fraction. She is feeling better but c/o myalgias and severe joint pain. We have reviewed her medications extensively during this visit and contacted her pharmacy during the appointment to confirm prescriptions. She tells me she is only taking two medications - one for blood pressure and one for diabetes. She confirms this several times and this is confirmed by pharmacist as well. These medications are losartan and Janumet. She is not taking a statin or aspirin as we had recommended upon hospital discharge.   We reviewed her echocardiogram which appears to have improved from hospitalization, with normalization of EF. She is feeling better aside from the musculoskeletal concerns.  From her hospitalization consultation: "Ms. Greig [name] was admitted 12/23 with SOB and COVID PNA from [facility]. She went to [facility] approx 12/20 for worsening SOB. O2 sats were in the 80s initially, improved w/ O2. Pt stated her whole family caught COVID after a social gathering. Her symptoms started about a week prior to admission. She had been started on Decadron at [facility], this was continued. She was also started on Remdesivir. She had elevated LFTs. Blood sugars were elevated because of the  steroids, levemir increased. She completed 5 days of Remdesivir, but was still requiring supplemental O2. Respiratory failure felt to be from underlying CHF. She was started on Lasix 40 mg IV BID on 12/27. Amount of overload unclear since output was not charted. No weights in system since 12/27. Inflammatory markers were improved, but she continue to have SOB. EF 45-50% by echo. Cards asked to see. The patient tells me that prior to admission to the hospital she had no chest discomfort, shortness of breath, or lower extremity edema.  She denies any syncope, dizziness.  She does endorse a cough which worsened just prior to presentation.  She is felt to be short of breath, however speaks in long and full sentences without increased work of breathing.  She does intermittently cough while on the line.  She tells me she has no lower extremity edema at this time and overall she feels she is improving. CT PE was negative for pulmonary embolism but did show evidence of Covid pneumonia."  Dr. Lorie Apley is her PCP.  Past Medical History:  Diagnosis Date  . Cerebral abscess 2019  . Diabetes mellitus, type 2 (HCC)   . HTN (hypertension)     Past Surgical History:  Procedure Laterality Date  . CRANIOTOMY  2019    Current Medications: Current Meds  Medication Sig  . albuterol (VENTOLIN HFA) 108 (90 Base) MCG/ACT inhaler Inhale 2 puffs into the lungs every 6 (six) hours as needed for wheezing or shortness of breath.  Marland Kitchen ascorbic acid (VITAMIN C) 500 MG tablet Take 1 tablet (500 mg total) by mouth daily.  Marland Kitchen  guaiFENesin-dextromethorphan (ROBITUSSIN DM) 100-10 MG/5ML syrup Take 5 mLs by mouth every 4 (four) hours as needed for cough.  . insulin NPH Human (NOVOLIN N) 100 UNIT/ML injection Inject 50 units subcutaneously every morning and 45 units at night  . losartan (COZAAR) 50 MG tablet Take 50 mg by mouth daily.  . sitaGLIPtin-metformin (JANUMET) 50-1000 MG tablet Take 1 tablet by mouth 2 (two) times daily with a  meal.  . zinc sulfate 220 (50 Zn) MG capsule Take 1 capsule (220 mg total) by mouth daily.     Allergies:   Patient has no known allergies.   Social History   Socioeconomic History  . Marital status: Single    Spouse name: Not on file  . Number of children: Not on file  . Years of education: Not on file  . Highest education level: Not on file  Occupational History  . Not on file  Tobacco Use  . Smoking status: Never Smoker  . Smokeless tobacco: Never Used  Substance and Sexual Activity  . Alcohol use: Never  . Drug use: Not on file  . Sexual activity: Not on file  Other Topics Concern  . Not on file  Social History Narrative  . Not on file   Social Determinants of Health   Financial Resource Strain:   . Difficulty of Paying Living Expenses: Not on file  Food Insecurity:   . Worried About Charity fundraiser in the Last Year: Not on file  . Ran Out of Food in the Last Year: Not on file  Transportation Needs:   . Lack of Transportation (Medical): Not on file  . Lack of Transportation (Non-Medical): Not on file  Physical Activity:   . Days of Exercise per Week: Not on file  . Minutes of Exercise per Session: Not on file  Stress:   . Feeling of Stress : Not on file  Social Connections:   . Frequency of Communication with Friends and Family: Not on file  . Frequency of Social Gatherings with Friends and Family: Not on file  . Attends Religious Services: Not on file  . Active Member of Clubs or Organizations: Not on file  . Attends Archivist Meetings: Not on file  . Marital Status: Not on file     Family History: The patient's family history is not on file.  ROS:   Please see the history of present illness.    All other systems reviewed and are negative.  EKGs/Labs/Other Studies Reviewed:    The following studies were reviewed today:  EKG:  Sinus tachycardia, LPFB, T wave abnormality ? Inferior ischemia.  I have independently reviewed the images  from Chest Xray/CTA chest dated 06/28/2019 and 06/30/2019.  Recent Labs: 06/23/2019: Hemoglobin 12.4; Platelets 196 06/27/2019: ALT 112 06/30/2019: B Natriuretic Peptide 16.3 07/01/2019: BUN 28; Creatinine, Ser 0.91; Potassium 4.7; Sodium 133  Recent Lipid Panel No results found for: CHOL, TRIG, HDL, CHOLHDL, VLDL, LDLCALC, LDLDIRECT  Physical Exam:    VS:  BP 138/80   Pulse (!) 106   Temp 97.6 F (36.4 C)   Ht 5\' 2"  (1.575 m)   Wt 270 lb (122.5 kg)   SpO2 97%   BMI 49.38 kg/m     Wt Readings from Last 5 Encounters:  09/09/19 270 lb (122.5 kg)  06/27/19 261 lb 14.5 oz (118.8 kg)    Constitutional: No acute distress.  Eyes: sclera non-icteric, normal conjunctiva and lids ENMT: normal dentition, moist mucous membranes Cardiovascular: regular rhythm,  normal rate, no murmurs. S1 and S2 normal. Radial pulses normal bilaterally. No jugular venous distention.  Respiratory: clear to auscultation bilaterally GI : normal bowel sounds, soft and nontender. No distention.   MSK: extremities warm, well perfused. No edema.  NEURO: grossly nonfocal exam, moves all extremities. PSYCH: alert and oriented x 3, normal mood and affect.   ASSESSMENT:    1. Cardiomyopathy, unspecified type (HCC)   2. Morbid obesity (HCC)   3. Transaminitis   4. Type 2 diabetes mellitus without complication, with long-term current use of insulin (HCC)   5. Pneumonia due to COVID-19 virus   6. Essential hypertension    PLAN:    Cardiomyopathy, unspecified type (HCC) - normalization of EF on recheck echo, suggests transient decrease due to acute COVID infection. Continue losartan. Consider addition of beta blocker though patient feels well and is content on her current medication regimen.  Morbid obesity (HCC) - discussed healthy diet and lifestyle modifications. This may help with her lower extremity pain.   Transaminitis - per the patient, this has resolved at recheck with her PCP. Would be appropriate to  consider statin therapy with history of diabetes, moderate to high intensity.   Type 2 diabetes mellitus without complication, with long-term current use of insulin (HCC) - per PCP  Pneumonia due to COVID-19 virus - improved, feeling back to baseline  Essential hypertension - continue losartan, BP with reasonable control.  Total time of encounter: 45 minutes total time of encounter, including 40 minutes spent in face-to-face patient care. This time includes coordination of care and counseling regarding above mentioned problem list. Remainder of non-face-to-face time involved reviewing chart documents/testing relevant to the patient encounter and documentation in the medical record. I have independently reviewed documentation from referring provider.   Weston Brass, MD [place]  [facility]    Medication Adjustments/Labs and Tests Ordered: Current medicines are reviewed at length with the patient today.  Concerns regarding medicines are outlined above.  No orders of the defined types were placed in this encounter.  No orders of the defined types were placed in this encounter.   Patient Instructions  Medication Instructions:  Continue same medications *If you need a refill on your cardiac medications before your next appointment, please call your pharmacy*   Lab Work: None ordered    Testing/Procedures: None ordered   Follow-Up: At [facility], you and your health needs are our priority.  As part of our continuing mission to provide you with exceptional heart care, we have created designated Provider Care Teams.  These Care Teams include your primary Cardiologist (physician) and Advanced Practice Providers (APPs -  Physician Assistants and Nurse Practitioners) who all work together to provide you with the care you need, when you need it.  We recommend signing up for the patient portal called "MyChart".  Sign up information is provided on this After Visit Summary.   MyChart is used to connect with patients for Virtual Visits (Telemedicine).  Patients are able to view lab/test results, encounter notes, upcoming appointments, etc.  Non-urgent messages can be sent to your provider as well.   To learn more about what you can do with MyChart, go to [email or web address].    Your next appointment:  3 months  Monday 12/21/19 at 1:45 pm   The format for your next appointment: Office     Provider: Corine Shelter PA

## 2019-09-09 NOTE — Patient Instructions (Addendum)
Medication Instructions:  Continue same medications *If you need a refill on your cardiac medications before your next appointment, please call your pharmacy*   Lab Work: None ordered    Testing/Procedures: None ordered   Follow-Up: At [facility], you and your health needs are our priority.  As part of our continuing mission to provide you with exceptional heart care, we have created designated Provider Care Teams.  These Care Teams include your primary Cardiologist (physician) and Advanced Practice Providers (APPs -  Physician Assistants and Nurse Practitioners) who all work together to provide you with the care you need, when you need it.  We recommend signing up for the patient portal called "MyChart".  Sign up information is provided on this After Visit Summary.  MyChart is used to connect with patients for Virtual Visits (Telemedicine).  Patients are able to view lab/test results, encounter notes, upcoming appointments, etc.  Non-urgent messages can be sent to your provider as well.   To learn more about what you can do with MyChart, go to [email or web address].    Your next appointment:  3 months  Monday 12/21/19 at 1:45 pm   The format for your next appointment: Office     Provider: Corine Shelter PA

## 2019-12-21 ENCOUNTER — Ambulatory Visit: Payer: Self-pay | Admitting: Cardiology

## 2021-04-06 ENCOUNTER — Inpatient Hospital Stay: Payer: Self-pay | Attending: Hematology and Oncology | Admitting: Hematology and Oncology

## 2021-04-06 ENCOUNTER — Other Ambulatory Visit: Payer: Self-pay

## 2021-04-06 VITALS — BP 151/82 | HR 90 | Resp 16 | Ht 68.0 in | Wt 281.0 lb

## 2021-04-06 DIAGNOSIS — Z124 Encounter for screening for malignant neoplasm of cervix: Secondary | ICD-10-CM

## 2021-04-06 NOTE — Progress Notes (Signed)
Ms. Stephanie [name] is a 60 y.o. No obstetric history on file. female who presents to [facility] clinic today with no complaints .    Pap Smear: Pap smear completed today. Last Pap smear was 2011 at  clinic and was normal. Per patient has no history of an abnormal Pap smear. Last Pap smear result is not available in Epic.   Physical exam: Breasts Breasts symmetrical. No skin abnormalities bilateral breasts. No nipple retraction bilateral breasts. No nipple discharge bilateral breasts. No lymphadenopathy. No lumps palpated bilateral breasts.       Pelvic/Bimanual Ext Genitalia No lesions, no swelling and no discharge observed on external genitalia.        Vagina Vagina pink and normal texture. No lesions or discharge observed in vagina.        Cervix Cervix is present. Cervix pink and of normal texture. No discharge observed.    Uterus Uterus is present and palpable. Uterus in normal position and normal size.        Adnexae Bilateral ovaries present and palpable. No tenderness on palpation.         Rectovaginal No rectal exam completed today since patient had no rectal complaints. No skin abnormalities observed on exam.     Smoking History: Patient has never smoked and was not referred to quit line.    Patient Navigation: Patient education provided. Access to services provided for patient through [facility] program. No interpreter provided. No transportation provided   Colorectal Cancer Screening: Per patient has never had colonoscopy completed No complaints today.    Breast and Cervical Cancer Risk Assessment: Patient does not have family history of breast cancer, known genetic mutations, or radiation treatment to the chest before age 40. Patient does not have history of cervical dysplasia, immunocompromised, or DES exposure in-utero.  Risk Assessment   No risk assessment data     A: B[facility]CP exam with pap smear No complaints today. Pelvic and breast exams  benign  P: Referred patient to the [facility] for a screening mammogram. Appointment scheduled .  Pascal Lux, NP 04/06/2021 3:44 PM

## 2021-04-10 LAB — CYTOLOGY - PAP
Comment: NEGATIVE
Diagnosis: NEGATIVE
High risk HPV: NEGATIVE

## 2021-04-14 ENCOUNTER — Telehealth: Payer: Self-pay

## 2021-04-14 NOTE — Telephone Encounter (Signed)
Phone contact with pt giving PAP and HPV results as well as contact number for scheduling to get mammo scheduled.

## 2021-05-29 ENCOUNTER — Encounter: Payer: Self-pay | Admitting: Hematology and Oncology

## 2021-10-04 IMAGING — DX DG CHEST 1V PORT SAME DAY
1 series · 1 of 1 positions shown · non-contrast
Comparison: 06/24/2019

CLINICAL DATA: Respiratory failure.  [id number].

EXAM:
PORTABLE CHEST 1 VIEW

[chest]
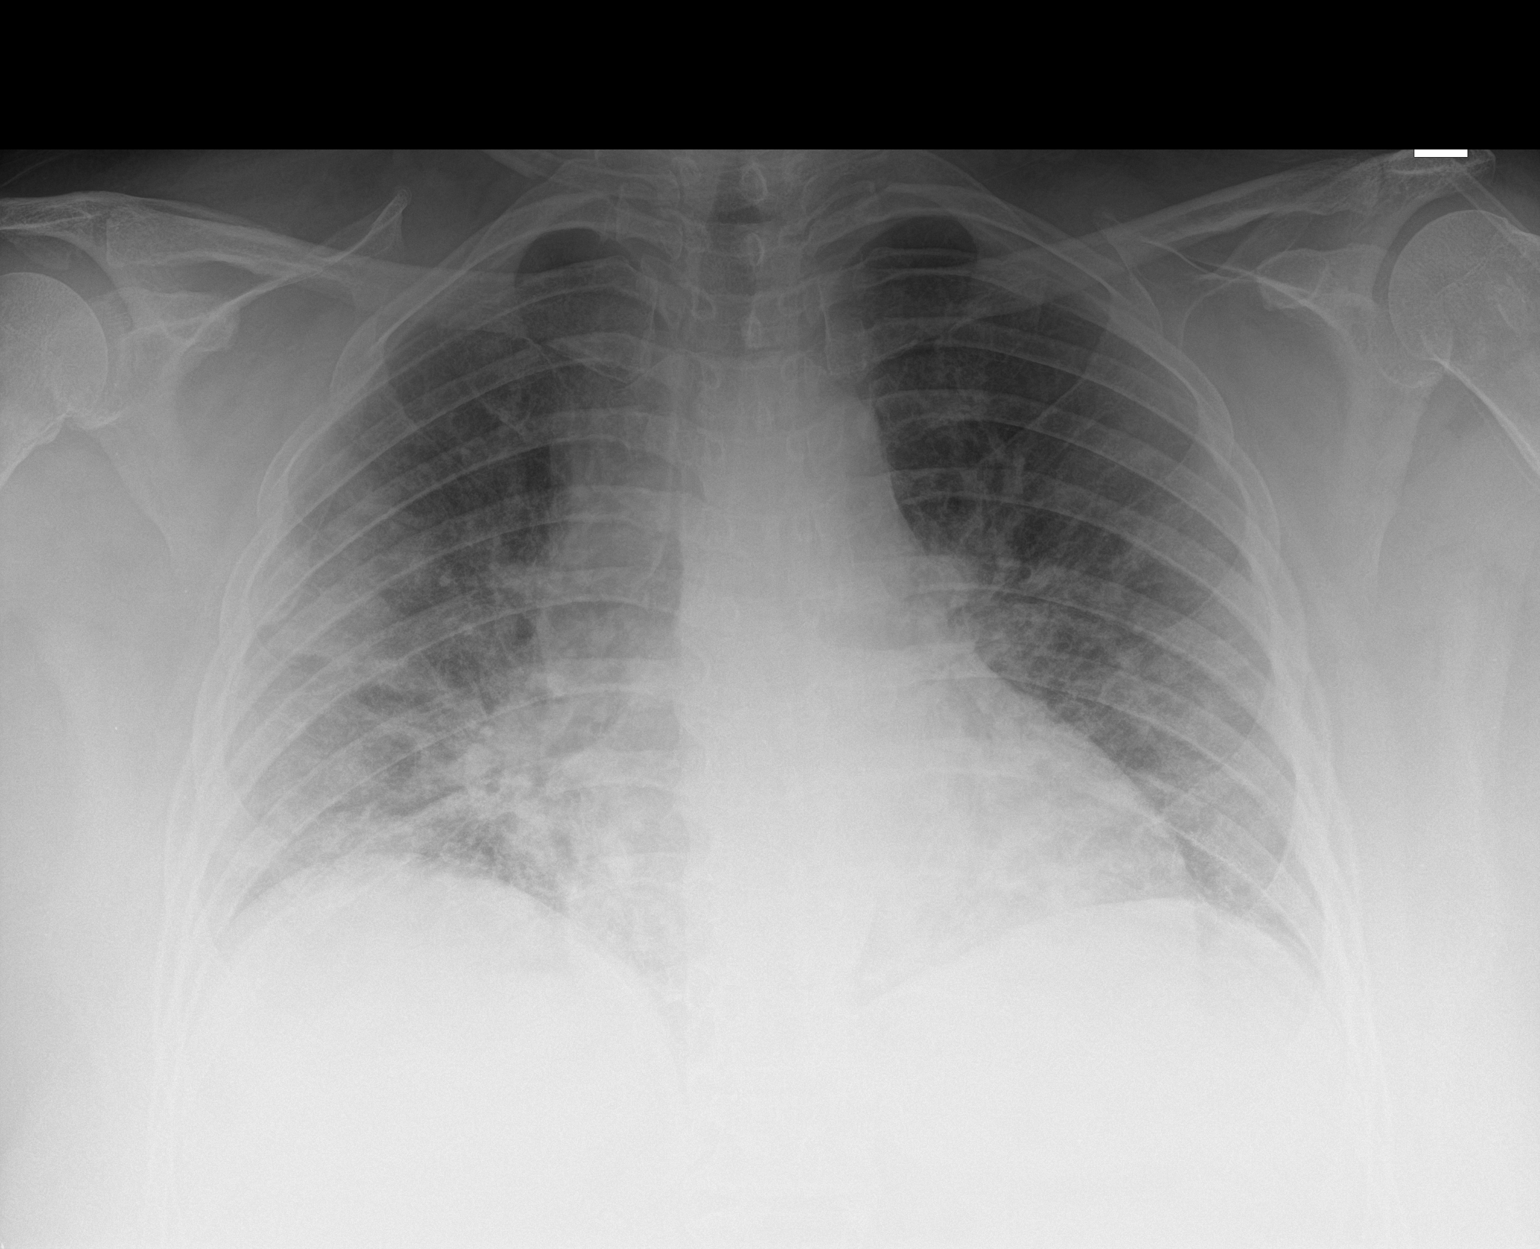

[1 of 1 positions shown; findings below may reference images not displayed]

FINDINGS: 8778 hours. Low lung volumes. Slight interval decrease in patchy
ill-defined bilateral airspace disease right slightly more than
left. No pleural effusion. Cardiopericardial silhouette is at upper
limits of normal for size. The visualized bony structures of the
thorax are intact.
IMPRESSION: Asymmetric ill-defined patchy airspace disease in the mid and lower
lungs appears slightly improved in the interval.

## 2022-12-29 ENCOUNTER — Observation Stay (HOSPITAL_COMMUNITY)
Admission: EM | Admit: 2022-12-29 | Discharge: 2022-12-30 | Disposition: A | Discharge status: Home / Self Care | Payer: Self-pay | Attending: Family Medicine | Admitting: Family Medicine

## 2022-12-29 ENCOUNTER — Other Ambulatory Visit: Payer: Self-pay

## 2022-12-29 ENCOUNTER — Encounter (HOSPITAL_COMMUNITY): Payer: Self-pay | Admitting: Emergency Medicine

## 2022-12-29 ENCOUNTER — Emergency Department (HOSPITAL_COMMUNITY): Payer: Self-pay

## 2022-12-29 DIAGNOSIS — Z8679 Personal history of other diseases of the circulatory system: Secondary | ICD-10-CM

## 2022-12-29 DIAGNOSIS — Z79899 Other long term (current) drug therapy: Secondary | ICD-10-CM | POA: Insufficient documentation

## 2022-12-29 DIAGNOSIS — Z794 Long term (current) use of insulin: Secondary | ICD-10-CM | POA: Insufficient documentation

## 2022-12-29 DIAGNOSIS — R531 Weakness: Secondary | ICD-10-CM

## 2022-12-29 DIAGNOSIS — E119 Type 2 diabetes mellitus without complications: Secondary | ICD-10-CM

## 2022-12-29 DIAGNOSIS — R739 Hyperglycemia, unspecified: Secondary | ICD-10-CM

## 2022-12-29 DIAGNOSIS — E1165 Type 2 diabetes mellitus with hyperglycemia: Secondary | ICD-10-CM | POA: Insufficient documentation

## 2022-12-29 DIAGNOSIS — R569 Unspecified convulsions: Principal | ICD-10-CM

## 2022-12-29 DIAGNOSIS — I10 Essential (primary) hypertension: Secondary | ICD-10-CM

## 2022-12-29 DIAGNOSIS — Z7984 Long term (current) use of oral hypoglycemic drugs: Secondary | ICD-10-CM | POA: Insufficient documentation

## 2022-12-29 LAB — COMPREHENSIVE METABOLIC PANEL
ALT: 35 U/L (ref 0–44)
AST: 26 U/L (ref 15–41)
Albumin: 3.7 g/dL (ref 3.5–5.0)
Alkaline Phosphatase: 83 U/L (ref 38–126)
Anion gap: 11 (ref 5–15)
BUN: 15 mg/dL (ref 8–23)
CO2: 21 mmol/L — ABNORMAL LOW (ref 22–32)
Calcium: 9.1 mg/dL (ref 8.9–10.3)
Chloride: 105 mmol/L (ref 98–111)
Creatinine, Ser: 0.76 mg/dL (ref 0.44–1.00)
GFR, Estimated: >60 mL/min (ref >60)
Glucose, Bld: 357 mg/dL — ABNORMAL HIGH (ref 70–99)
Potassium: 3.9 mmol/L (ref 3.5–5.1)
Sodium: 137 mmol/L (ref 135–145)
Total Bilirubin: 0.6 mg/dL (ref 0.3–1.2)
Total Protein: 6.8 g/dL (ref 6.5–8.1)

## 2022-12-29 LAB — I-STAT CHEM 8, ED
BUN: 16 mg/dL (ref 8–23)
Calcium, Ion: 1.17 mmol/L (ref 1.15–1.40)
Chloride: 106 mmol/L (ref 98–111)
Creatinine, Ser: 0.6 mg/dL (ref 0.44–1.00)
Glucose, Bld: 362 mg/dL — ABNORMAL HIGH (ref 70–99)
HCT: 43 % (ref 36.0–46.0)
Hemoglobin: 14.6 g/dL (ref 12.0–15.0)
Potassium: 3.8 mmol/L (ref 3.5–5.1)
Sodium: 140 mmol/L (ref 135–145)
TCO2: 23 mmol/L (ref 22–32)

## 2022-12-29 LAB — DIFFERENTIAL
Abs Immature Granulocytes: 0.03 10*3/uL (ref 0.00–0.07)
Basophils Absolute: 0.1 10*3/uL (ref 0.0–0.1)
Basophils Relative: 1 %
Eosinophils Absolute: 0.1 10*3/uL (ref 0.0–0.5)
Eosinophils Relative: 1 %
Immature Granulocytes: 0 %
Lymphocytes Relative: 32 %
Lymphs Abs: 4 10*3/uL (ref 0.7–4.0)
Monocytes Absolute: 0.6 10*3/uL (ref 0.1–1.0)
Monocytes Relative: 5 %
Neutro Abs: 7.7 10*3/uL (ref 1.7–7.7)
Neutrophils Relative %: 61 %

## 2022-12-29 LAB — CBG MONITORING, ED: Glucose-Capillary: 335 mg/dL — ABNORMAL HIGH (ref 70–99)

## 2022-12-29 LAB — CBC
HCT: 42.6 % (ref 36.0–46.0)
Hemoglobin: 13.6 g/dL (ref 12.0–15.0)
MCH: 28.2 pg (ref 26.0–34.0)
MCHC: 31.9 g/dL (ref 30.0–36.0)
MCV: 88.2 fL (ref 80.0–100.0)
Platelets: 243 10*3/uL (ref 150–400)
RBC: 4.83 MIL/uL (ref 3.87–5.11)
RDW: 13.6 % (ref 11.5–15.5)
WBC: 12.5 10*3/uL — ABNORMAL HIGH (ref 4.0–10.5)
nRBC: 0 % (ref 0.0–0.2)

## 2022-12-29 LAB — APTT: aPTT: 26 seconds (ref 24–36)

## 2022-12-29 LAB — ETHANOL: Alcohol, Ethyl (B): <10 mg/dL (ref <10)

## 2022-12-29 LAB — PROTIME-INR
INR: 0.9 (ref 0.8–1.2)
Prothrombin Time: 12.6 seconds (ref 11.4–15.2)

## 2022-12-29 MED ORDER — LEVETIRACETAM 500 MG PO TABS
500.0000 mg | ORAL_TABLET | Freq: Two times a day (BID) | ORAL | Status: DC
  Filled 2022-12-29: qty 1

## 2022-12-29 MED ORDER — LORAZEPAM 2 MG/ML IJ SOLN
INTRAMUSCULAR | Status: AC
  Filled 2022-12-29: qty 1

## 2022-12-29 MED ORDER — SODIUM CHLORIDE 0.9% FLUSH
3.0000 mL | Freq: Once | INTRAVENOUS | Status: AC
  Administered 2022-12-29: 3 mL via INTRAVENOUS

## 2022-12-29 MED ORDER — LEVETIRACETAM IN NACL 1500 MG/100ML IV SOLN
1500.0000 mg | Freq: Once | INTRAVENOUS | Status: AC
  Administered 2022-12-30: 1500 mg via INTRAVENOUS
  Filled 2022-12-29 (×2): qty 100

## 2022-12-29 MED ORDER — LEVETIRACETAM IN NACL 1500 MG/100ML IV SOLN
1500.0000 mg | Freq: Once | INTRAVENOUS | Status: AC
  Administered 2022-12-29: 1500 mg via INTRAVENOUS
  Filled 2022-12-29: qty 100

## 2022-12-29 MED ORDER — ACETAMINOPHEN 325 MG PO TABS
650.0000 mg | ORAL_TABLET | Freq: Once | ORAL | Status: AC
  Administered 2022-12-30: 650 mg via ORAL
  Filled 2022-12-29: qty 2

## 2022-12-29 NOTE — ED Provider Notes (Signed)
[place] EMERGENCY DEPARTMENT AT [facility] Provider Note   CSN: [id number] Arrival date & time: 12/29/22  2200     History {Add pertinent medical, surgical, social history, OB history to HPI:1} No chief complaint on file.   Stephanie [name] is a 62 y.o. female.  She is brought in by EMS as a stroke activation.  She was a passenger in a car driving with her husband when she acutely had a headache and began experiencing weakness in her left arm and left leg.  Slurred speech.  EMS said during transport she had a leftward gaze and some clenched posturing for about 30 seconds.  She is Spanish-speaking, iPad interpreter was used during her presentation to the emergency department.  She was met with the neurology team and taken emergently to CT.  History of diabetes initial blood sugar 330.  Blood pressure significantly elevated  The history is provided by the patient and the EMS personnel.  Cerebrovascular Accident This is a new problem. The current episode started 1 to 2 hours ago. The problem occurs constantly. The problem has not changed since onset.Associated symptoms include headaches. Nothing aggravates the symptoms. Nothing relieves the symptoms. She has tried nothing for the symptoms. The treatment provided no relief.       Home Medications Prior to Admission medications   Medication Sig Start Date End Date Taking? Authorizing Provider  albuterol (VENTOLIN HFA) 108 (90 Base) MCG/ACT inhaler Inhale 2 puffs into the lungs every 6 (six) hours as needed for wheezing or shortness of breath. 07/02/19   Meredeth Ide, MD  ascorbic acid (VITAMIN C) 500 MG tablet Take 1 tablet (500 mg total) by mouth daily. 07/03/19   Meredeth Ide, MD  guaiFENesin-dextromethorphan (ROBITUSSIN DM) 100-10 MG/5ML syrup Take 5 mLs by mouth every 4 (four) hours as needed for cough. 07/02/19   Meredeth Ide, MD  insulin NPH Human (NOVOLIN N) 100 UNIT/ML injection Inject 50 units subcutaneously  every morning and 45 units at night 07/07/19   Marcine Matar, MD  losartan (COZAAR) 50 MG tablet Take 50 mg by mouth daily.    [provider]  sitaGLIPtin-metformin (JANUMET) 50-1000 MG tablet Take 1 tablet by mouth 2 (two) times daily with a meal.    [provider]  zinc sulfate 220 (50 Zn) MG capsule Take 1 capsule (220 mg total) by mouth daily. 07/03/19   Meredeth Ide, MD      Allergies    Patient has no known allergies.    Review of Systems   Review of Systems  Neurological:  Positive for seizures, speech difficulty, weakness and headaches.    Physical Exam Updated Vital Signs There were no vitals taken for this visit. Physical Exam Vitals and nursing note reviewed.  Constitutional:      General: She is not in acute distress.    Appearance: Normal appearance. She is well-developed. She is obese.  HENT:     Head: Normocephalic and atraumatic.  Eyes:     Conjunctiva/sclera: Conjunctivae normal.  Cardiovascular:     Rate and Rhythm: Normal rate and regular rhythm.     Heart sounds: No murmur heard. Pulmonary:     Effort: Pulmonary effort is normal. No respiratory distress.     Breath sounds: Normal breath sounds.  Abdominal:     Palpations: Abdomen is soft.     Tenderness: There is no abdominal tenderness. There is no guarding or rebound.  Musculoskeletal:  General: No swelling.     Cervical back: Neck supple.  Skin:    General: Skin is warm and dry.     Capillary Refill: Capillary refill takes less than 2 seconds.  Neurological:     Mental Status: She is alert. She is disoriented.     Cranial Nerves: No cranial nerve deficit.     Sensory: No sensory deficit.     Motor: No weakness.     ED Results / Procedures / Treatments   Labs (all labs ordered are listed, but only abnormal results are displayed) Labs Reviewed  CBC - Abnormal; Notable for the following components:      Result Value   WBC 12.5 (*)    All other components within  normal limits  COMPREHENSIVE METABOLIC PANEL - Abnormal; Notable for the following components:   CO2 21 (*)    Glucose, Bld 357 (*)    All other components within normal limits  I-STAT CHEM 8, ED - Abnormal; Notable for the following components:   Glucose, Bld 362 (*)    All other components within normal limits  CBG MONITORING, ED - Abnormal; Notable for the following components:   Glucose-Capillary 335 (*)    All other components within normal limits  PROTIME-INR  APTT  DIFFERENTIAL  ETHANOL    EKG None  Radiology CT HEAD CODE STROKE WO CONTRAST  Result Date: 12/29/2022 CLINICAL DATA:  Code stroke. EXAM: CT HEAD WITHOUT CONTRAST TECHNIQUE: Contiguous axial images were obtained from the base of the skull through the vertex without intravenous contrast. RADIATION DOSE REDUCTION: This exam was performed according to the departmental dose-optimization program which includes automated exposure control, adjustment of the mA and/or kV according to patient size and/or use of iterative reconstruction technique. COMPARISON:  Prior study from 07/16/2021. FINDINGS: Brain: Cerebral volume within normal limits. Chronic [name] parieto-occipital encephalomalacia. No acute intracranial hemorrhage. No acute large vessel territory infarct. No mass lesion or midline shift. No hydrocephalus or extra-axial fluid collection. Vascular: No abnormal hyperdense vessel. Skull: Scalp soft tissues demonstrate no acute finding. Prior [name] posterior craniotomy. Sinuses/Orbits: Globes orbital soft tissues within normal limits. Paranasal sinuses and mastoid air cells are largely clear. Other: None. ASPECTS [facility] Stroke Program Early CT Score) - Ganglionic level infarction (caudate, lentiform nuclei, internal capsule, insula, M1-M3 cortex): 7 - Supraganglionic infarction (M4-M6 cortex): 3 Total score (0-10 with 10 being normal): 10 IMPRESSION: 1. No acute intracranial abnormality. 2. ASPECTS is 10. 3. Chronic [name]  parieto-occipital encephalomalacia. These results were communicated to Dr. Otelia Limes at 10:32 pm on 12/29/2022 by text page via the [facility] messaging system. Electronically Signed   By: Rise Mu M.D.   On: 12/29/2022 22:34    Procedures Procedures  {Document cardiac monitor, telemetry assessment procedure when appropriate:1}  Medications Ordered in ED Medications  sodium chloride flush (NS) 0.9 % injection 3 mL (has no administration in time range)    ED Course/ Medical Decision Making/ A&P   {   Click here for ABCD2, HEART and other calculatorsREFRESH Note before signing :1}                          Medical Decision Making Amount and/or Complexity of Data Reviewed Labs: ordered. Radiology: ordered.   This patient complains of ***; this involves an extensive number of treatment Options and is a complaint that carries with it a high risk of complications and morbidity. The differential includes ***  I ordered, reviewed and  interpreted labs, which included *** I ordered medication *** and reviewed PMP when indicated. I ordered imaging studies which included *** and I independently    visualized and interpreted imaging which showed *** Additional history obtained from *** Previous records obtained and reviewed *** I consulted *** and discussed lab and imaging findings and discussed disposition.  Cardiac monitoring reviewed, *** Social determinants considered, *** Critical Interventions: ***  After the interventions stated above, I reevaluated the patient and found *** Admission and further testing considered, ***   {Document critical care time when appropriate:1} {Document review of labs and clinical decision tools ie heart score, Chads2Vasc2 etc:1}  {Document your independent review of radiology images, and any outside records:1} {Document your discussion with family members, caretakers, and with consultants:1} {Document social determinants of health affecting pt's  care:1} {Document your decision making why or why not admission, treatments were needed:1} Final Clinical Impression(s) / ED Diagnoses Final diagnoses:  None    Rx / DC Orders ED Discharge Orders     None

## 2022-12-29 NOTE — Code Documentation (Signed)
Stroke Response Nurse Documentation Code Documentation  Stephanie [name] Right is a 62 y.o. female arriving to [facility]  via [facility] on 6/29 with past medical hx of DM, HTN, SDH. On No antithrombotic. Code stroke was activated by EMS.   Patient from home where she was LKW at 2100 and now complaining of slurred speech and AMS .  Stroke team at the bedside on patient arrival. Labs drawn and patient cleared for CT by Dr. Charm Barges. Patient to CT with team. NIHSS 1, see documentation for details and code stroke times. Patient with disoriented on exam. The following imaging was completed:  CT Head. Patient is not a candidate for IV Thrombolytic due to too good to treat. Patient is not a candidate for IR due to low suspicion for LVO. Seizure witness in CT with left sided gaze and extended LUE.     Bedside handoff with ED RN Minette Headland, Piedad Climes  Rapid Response RN

## 2022-12-29 NOTE — Consult Note (Signed)
NEURO HOSPITALIST CONSULT NOTE   Requestig physician: Dr. Charm Barges  Reason for Consult: Acute onset of left facial droop with LUE and LLE weakness  History obtained from:  EMS, Patient and Chart     HPI:                                                                                                                                          Stephanie [name] is an 62 y.o. female with a PMHx of cerebral right parieto-occipital abscess, brain surgery 4 years ago for right subdural hematoma evacuation (Jan 2019 craniotomy), DM2 and HTN who presents to the ED via EMS as a Code Stroke after acute onset of left sided weakness while riding in her husbands car as a passenger while on the way to a family dinner. LKN 2100. She suddenly experienced headache pain while in the car, followed by acute onset of left arm and leg weakness. After arrival to the family home, she was unable to get out of the car. On EMS arrival she had BP 260/140. While she was being moved by EMS, she had transient left arm tonic flexion and head turned to the left, lasting for about 3 seconds. Later, while in the ambulance, she had a second similar episode for longer duration of 30 seconds, with sudden onset of speech arrest and unresponsiveness to sternal rub and jerking movements of LUE in addition to looking to the left. She postictal afterwards She continued to complain of a severe headache, mostly behind her left eye but also moving across her forehead. She "said her left eye was flashing" per EMS. CBG was in the 300's on arrival with repeat glucose on i-STAT of 362.   She has no history of seizures. She states that she was seen at [facility] and had an EEG there, but was told by the doctor that testing did not show any seizures.   Past Medical History:  Diagnosis Date   Cerebral abscess 2019   Diabetes mellitus, type 2 (HCC)    HTN (hypertension)     Past Surgical History:  Procedure Laterality Date    CRANIOTOMY  2019    No family history on file.           Social History:  reports that she has never smoked. She has never used smokeless tobacco. She reports that she does not drink alcohol. No history on file for drug use.  No Known Allergies  HOME MEDICATIONS:  No current facility-administered medications on file prior to encounter.   Current Outpatient Medications on File Prior to Encounter  Medication Sig Dispense Refill   albuterol (VENTOLIN HFA) 108 (90 Base) MCG/ACT inhaler Inhale 2 puffs into the lungs every 6 (six) hours as needed for wheezing or shortness of breath. 8 g 1   ascorbic acid (VITAMIN C) 500 MG tablet Take 1 tablet (500 mg total) by mouth daily. 10 tablet 0   guaiFENesin-dextromethorphan (ROBITUSSIN DM) 100-10 MG/5ML syrup Take 5 mLs by mouth every 4 (four) hours as needed for cough. 118 mL 0   insulin NPH Human (NOVOLIN N) 100 UNIT/ML injection Inject 50 units subcutaneously every morning and 45 units at night 20 mL 3   losartan (COZAAR) 50 MG tablet Take 50 mg by mouth daily.     sitaGLIPtin-metformin (JANUMET) 50-1000 MG tablet Take 1 tablet by mouth 2 (two) times daily with a meal.     zinc sulfate 220 (50 Zn) MG capsule Take 1 capsule (220 mg total) by mouth daily. 10 capsule 0     ROS:                                                                                                                                       She endorses a severe right occipital headache. Other ROS as per HPI.   Wt 119.4 kg   BMI 40.02 kg/m   General Examination:                                                                                                       Physical Exam  HEENT-  [place]/AT    Lungs- Respirations unlabored Extremities- No edema  Neurological Examination Mental Status: Alert, oriented x 5, thought content appropriate.  Speech fluent without  evidence of aphasia.  Able to follow all commands without difficulty. Cranial Nerves: II: Temporal visual fields intact with no extinction to DSS. PERRL  III,IV, VI: No ptosis. EOMI.  V: Temp sensation equal bilaterally  VII: Smile symmetric VIII: Hearing intact to voice IX,X: No hoarseness XI: Symmetric shoulder shrug XII: Midline tongue extension Motor: BUE 5/5 proximally and distally against resistance Positive orbiting fingers test on the left.  BLE 5/5 proximally and distally  Mild drift to LUE.  Sensory: Temp subjectively equal bilaterally. There is intermittent decreased responsiveness to left arm and leg tactile stimulation.  Deep Tendon Reflexes: 2+ bilateral brachioradialis. Right patellar 1+, left patellar areflexic. Toes downgoing bilaterally.  Cerebellar: No ataxia  with FNF bilaterally  Gait: Deferred Other: While in CT she suddenly became unresponsive to questions and commands, with leftward gaze, leftward turning of head and increased tone of LUE with waxy rigidity of left > right upper extremity. There was no limb jerking. This lasted for about 60 seconds before abruptly resolving with patient's head and eyes moving back to midline and with return of speech without postictal state.    Lab Results: Basic Metabolic Panel: Recent Labs  Lab 12/29/22 2208  NA 140  K 3.8  CL 106  GLUCOSE 362*  BUN 16  CREATININE 0.60    CBC: Recent Labs  Lab 12/29/22 2208  HGB 14.6  HCT 43.0    Cardiac Enzymes: No results for input(s): "CKTOTAL", "CKMB", "CKMBINDEX", "TROPONINI" in the last 168 hours.  Lipid Panel: No results for input(s): "CHOL", "TRIG", "HDL", "CHOLHDL", "VLDL", "LDLCALC" in the last 168 hours.  Imaging: No results found.   Assessment: 62 year old female with a PMHx of cerebral right parieto-occipital abscess, brain surgery 4 years ago for right subdural hematoma evacuation (Jan 2019 craniotomy), DM2 and HTN who presents to the ED via EMS as a Code  Stroke after acute onset of left sided weakness while riding in her husbands car as a passenger while on the way to a family dinner. EMS witnessed left-sided motor activity x 2, the second with leftward head turning, left eye deviation and speech arrest that were suggestive of seizure activity on scene. During acute stroke evaluation in CT she experienced a third spell, appearing most consistent with a partial-complex seizure.   - Exam with left sided deficits in addition to witnessed seizure activity as documented above.  - CT head: No acute intracranial abnormality. ASPECTS is 10. Chronic right parieto-occipital encephalomalacia. 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 Overall presentation is not consistent with LVO.  - Overall impression: New onset of partial complex seizure activity, most likely secondary to seizure focus in proximity to the chronic right parieto-occipital lesion seen on CT. Hyperglycemia may also be a contributing factor.   Recommendations: - STAT EEG (ordered) - Keppra 3000 mg IV is being loaded - Continue Keppra at 500 mg po BID (ordered) - MRI brain with and without contrast.  - Inpatient seizure precautions - Outpatient seizure precautions: Per [facility] statutes, patients with seizures are not allowed to drive until  they have been seizure-free for six months. Use caution when using heavy equipment or power tools. Avoid working on ladders or at heights. Take showers instead of baths. Ensure the water temperature is not too high on the home water heater. Do not go swimming alone. When caring for infants or small children, sit down when holding, feeding, or changing them to minimize risk of injury to the child in the event you have a seizure. Also, Maintain good sleep hygiene. Avoid alcohol.   Electronically signed: Dr. Caryl Pina 12/29/2022, 10:11 PM

## 2022-12-29 NOTE — ED Triage Notes (Signed)
Patient arrived via [place] with complaints of change in mental status and left sided weakness, while in a car with husband, driving to family function. Patient was not able to use her left arm and leg and family was not able to get her out of the car. Then patient had seizure like activity for about 30 seconds with postictal according to EMS; no loss of bladder or bowl. Last known well at 2100 tonight.

## 2022-12-30 ENCOUNTER — Emergency Department (HOSPITAL_COMMUNITY): Payer: Self-pay

## 2022-12-30 DIAGNOSIS — E119 Type 2 diabetes mellitus without complications: Secondary | ICD-10-CM

## 2022-12-30 DIAGNOSIS — Z8679 Personal history of other diseases of the circulatory system: Secondary | ICD-10-CM

## 2022-12-30 DIAGNOSIS — Z794 Long term (current) use of insulin: Secondary | ICD-10-CM

## 2022-12-30 DIAGNOSIS — I10 Essential (primary) hypertension: Secondary | ICD-10-CM

## 2022-12-30 DIAGNOSIS — R569 Unspecified convulsions: Secondary | ICD-10-CM

## 2022-12-30 DIAGNOSIS — R531 Weakness: Secondary | ICD-10-CM

## 2022-12-30 LAB — BASIC METABOLIC PANEL
Anion gap: 7 (ref 5–15)
BUN: 10 mg/dL (ref 8–23)
CO2: 24 mmol/L (ref 22–32)
Calcium: 8.6 mg/dL — ABNORMAL LOW (ref 8.9–10.3)
Chloride: 106 mmol/L (ref 98–111)
Creatinine, Ser: 0.63 mg/dL (ref 0.44–1.00)
GFR, Estimated: >60 mL/min (ref >60)
Glucose, Bld: 229 mg/dL — ABNORMAL HIGH (ref 70–99)
Potassium: 3.6 mmol/L (ref 3.5–5.1)
Sodium: 137 mmol/L (ref 135–145)

## 2022-12-30 LAB — CBC
HCT: 37.4 % (ref 36.0–46.0)
Hemoglobin: 12.2 g/dL (ref 12.0–15.0)
MCH: 28.4 pg (ref 26.0–34.0)
MCHC: 32.6 g/dL (ref 30.0–36.0)
MCV: 87 fL (ref 80.0–100.0)
Platelets: 227 K/uL (ref 150–400)
RBC: 4.3 MIL/uL (ref 3.87–5.11)
RDW: 13.7 % (ref 11.5–15.5)
WBC: 11.7 K/uL — ABNORMAL HIGH (ref 4.0–10.5)
nRBC: 0 % (ref 0.0–0.2)

## 2022-12-30 LAB — URINALYSIS, ROUTINE W REFLEX MICROSCOPIC
Bilirubin Urine: NEGATIVE
Glucose, UA: >=500 mg/dL — AB
Hgb urine dipstick: NEGATIVE
Ketones, ur: NEGATIVE mg/dL
Leukocytes,Ua: NEGATIVE
Nitrite: NEGATIVE
Protein, ur: NEGATIVE mg/dL
Specific Gravity, Urine: 1.02 (ref 1.005–1.030)
pH: 6 (ref 5.0–8.0)

## 2022-12-30 LAB — CBG MONITORING, ED: Glucose-Capillary: 277 mg/dL — ABNORMAL HIGH (ref 70–99)

## 2022-12-30 LAB — RAPID URINE DRUG SCREEN, HOSP PERFORMED
Amphetamines: NOT DETECTED
Barbiturates: NOT DETECTED
Benzodiazepines: NOT DETECTED
Cocaine: NOT DETECTED
Opiates: NOT DETECTED
Tetrahydrocannabinol: NOT DETECTED

## 2022-12-30 LAB — GLUCOSE, CAPILLARY
Glucose-Capillary: 219 mg/dL — ABNORMAL HIGH (ref 70–99)
Glucose-Capillary: 200 mg/dL — ABNORMAL HIGH (ref 70–99)
Glucose-Capillary: 224 mg/dL — ABNORMAL HIGH (ref 70–99)
Glucose-Capillary: 251 mg/dL — ABNORMAL HIGH (ref 70–99)

## 2022-12-30 LAB — URINALYSIS, MICROSCOPIC (REFLEX)

## 2022-12-30 LAB — HIV ANTIBODY (ROUTINE TESTING W REFLEX): HIV Screen 4th Generation wRfx: NONREACTIVE

## 2022-12-30 MED ORDER — BLOOD GLUCOSE MONITORING SUPPL DEVI
1.0000 | Freq: Three times a day (TID) | 0 refills | Status: DC
Start: 2022-12-30 — End: 2022-12-30

## 2022-12-30 MED ORDER — DIVALPROEX SODIUM 500 MG PO DR TAB: 500.0000 mg | DELAYED_RELEASE_TABLET | Freq: Two times a day (BID) | ORAL | 3 refills | Status: DC

## 2022-12-30 MED ORDER — INSULIN SYRINGE-NEEDLE U-100 25G X 5/8" 1 ML MISC
1.0000 | Freq: Three times a day (TID) | 0 refills | Status: AC
Start: 2022-12-30 — End: ?

## 2022-12-30 MED ORDER — BLOOD GLUCOSE TEST VI STRP
1.0000 | ORAL_STRIP | Freq: Three times a day (TID) | 0 refills | Status: AC
Start: 2022-12-30 — End: ?

## 2022-12-30 MED ORDER — INSULIN NPH (HUMAN) (ISOPHANE) 100 UNIT/ML ~~LOC~~ SUSP: 20.0000 [IU] | Freq: Two times a day (BID) | SUBCUTANEOUS | 11 refills | Status: AC

## 2022-12-30 MED ORDER — ACETAMINOPHEN 650 MG RE SUPP: 650.0000 mg | Freq: Four times a day (QID) | RECTAL | Status: DC | PRN

## 2022-12-30 MED ORDER — BLOOD GLUCOSE MONITORING SUPPL DEVI
1.0000 | Freq: Three times a day (TID) | 0 refills | Status: AC
Start: 2022-12-30 — End: ?

## 2022-12-30 MED ORDER — INSULIN ASPART 100 UNIT/ML IJ SOLN
0.0000 [IU] | Freq: Three times a day (TID) | INTRAMUSCULAR | Status: DC
  Administered 2022-12-30: 5 [IU] via SUBCUTANEOUS
  Administered 2022-12-30: 8 [IU] via SUBCUTANEOUS
  Administered 2022-12-30: 5 [IU] via SUBCUTANEOUS

## 2022-12-30 MED ORDER — LANCET DEVICE MISC
1.0000 | Freq: Three times a day (TID) | 0 refills | Status: AC
Start: 2022-12-30 — End: ?

## 2022-12-30 MED ORDER — INSULIN NPH (HUMAN) (ISOPHANE) 100 UNIT/ML ~~LOC~~ SUSP: 20.0000 [IU] | Freq: Two times a day (BID) | SUBCUTANEOUS | 11 refills | Status: DC

## 2022-12-30 MED ORDER — LOSARTAN POTASSIUM 50 MG PO TABS
50.0000 mg | ORAL_TABLET | Freq: Every day | ORAL | Status: DC
  Administered 2022-12-30: 50 mg via ORAL
  Filled 2022-12-30: qty 1

## 2022-12-30 MED ORDER — LANCET DEVICE MISC
1.0000 | Freq: Three times a day (TID) | 0 refills | Status: DC
Start: 2022-12-30 — End: 2022-12-30

## 2022-12-30 MED ORDER — INSULIN ASPART 100 UNIT/ML IJ SOLN
5.0000 [IU] | Freq: Three times a day (TID) | INTRAMUSCULAR | Status: DC
  Administered 2022-12-30 (×3): 5 [IU] via SUBCUTANEOUS

## 2022-12-30 MED ORDER — ONDANSETRON HCL 4 MG/2ML IJ SOLN: 4.0000 mg | Freq: Four times a day (QID) | INTRAMUSCULAR | Status: DC | PRN

## 2022-12-30 MED ORDER — LORAZEPAM 2 MG/ML IJ SOLN: 2.0000 mg | Freq: Once | INTRAMUSCULAR | Status: DC | PRN

## 2022-12-30 MED ORDER — INSULIN NPH (HUMAN) (ISOPHANE) 100 UNIT/ML ~~LOC~~ SUSP
45.0000 [IU] | Freq: Two times a day (BID) | SUBCUTANEOUS | Status: DC
  Administered 2022-12-30 (×2): 45 [IU] via SUBCUTANEOUS
  Filled 2022-12-30 (×2): qty 10

## 2022-12-30 MED ORDER — PROCHLORPERAZINE EDISYLATE 10 MG/2ML IJ SOLN
10.0000 mg | Freq: Once | INTRAMUSCULAR | Status: AC
  Administered 2022-12-30: 10 mg via INTRAVENOUS
  Filled 2022-12-30: qty 2

## 2022-12-30 MED ORDER — INSULIN ASPART 100 UNIT/ML IJ SOLN
0.0000 [IU] | Freq: Every day | INTRAMUSCULAR | Status: DC
  Administered 2022-12-30: 3 [IU] via SUBCUTANEOUS

## 2022-12-30 MED ORDER — HEPARIN SODIUM (PORCINE) 5000 UNIT/ML IJ SOLN
5000.0000 [IU] | Freq: Three times a day (TID) | INTRAMUSCULAR | Status: DC
  Administered 2022-12-30 (×2): 5000 [IU] via SUBCUTANEOUS
  Filled 2022-12-30 (×2): qty 1

## 2022-12-30 MED ORDER — BLOOD GLUCOSE TEST VI STRP
1.0000 | ORAL_STRIP | Freq: Three times a day (TID) | 0 refills | Status: DC
Start: 2022-12-30 — End: 2022-12-30

## 2022-12-30 MED ORDER — ONDANSETRON HCL 4 MG PO TABS: 4.0000 mg | ORAL_TABLET | Freq: Four times a day (QID) | ORAL | Status: DC | PRN

## 2022-12-30 MED ORDER — "INSULIN SYRINGE-NEEDLE U-100 25G X 5/8"" 1 ML MISC": 1.0000 | Freq: Three times a day (TID) | 0 refills | Status: DC

## 2022-12-30 MED ORDER — DIVALPROEX SODIUM 500 MG PO DR TAB
500.0000 mg | DELAYED_RELEASE_TABLET | Freq: Two times a day (BID) | ORAL | Status: DC
  Administered 2022-12-30: 500 mg via ORAL
  Filled 2022-12-30 (×2): qty 1

## 2022-12-30 MED ORDER — ACETAMINOPHEN 325 MG PO TABS
650.0000 mg | ORAL_TABLET | Freq: Four times a day (QID) | ORAL | Status: DC | PRN
  Filled 2022-12-30: qty 2

## 2022-12-30 NOTE — ED Notes (Signed)
ED TO INPATIENT HANDOFF REPORT  ED Nurse Name and Phone #:  Gillis Ends [phone]  S Name/Age/Gender Stephanie [name] 62 y.o. female Room/Bed: 030C/030C  Code Status   Code Status: Prior  Home/SNF/Other Home Patient oriented to: self, place, time, and situation Is this baseline? Yes   Triage Complete: Triage complete  Chief Complaint Seizure [facility]) [R56.9]  Triage Note Patient arrived via [facility] with complaints of change in mental status and left sided weakness, while in a car with husband, driving to family function. Patient was not able to use her left arm and leg and family was not able to get her out of the car. Then patient had seizure like activity for about 30 seconds with postictal according to EMS; no loss of bladder or bowl. Last known well at 2100 tonight.      Allergies No Known Allergies  Level of Care/Admitting Diagnosis ED Disposition     ED Disposition  Admit   Condition  --   Comment  Hospital Area: [facility] [[id number]]  Level of Care: Telemetry Medical [104]  May place patient in observation at [facility] or [facility] if equivalent level of care is available:: No  Covid Evaluation: Asymptomatic - no recent exposure (last 10 days) testing not required  Diagnosis: Seizure (HCC) [205090]  Admitting Physician: Alvester Chou  Attending Physician: Alvester Chou          B Medical/Surgery History Past Medical History:  Diagnosis Date   Cerebral abscess 2019   Diabetes mellitus, type 2 (HCC)    HTN (hypertension)    Past Surgical History:  Procedure Laterality Date   CRANIOTOMY  2019     A IV Location/Drains/Wounds Patient Lines/Drains/Airways Status     Active Line/Drains/Airways     Name Placement date Placement time Site Days   Peripheral IV 06/30/19 Left;Upper Arm 06/30/19  1400  Arm  1279   Peripheral IV 12/29/22 Posterior;Right Hand 12/29/22  2236  Hand  1            Intake/Output  Last 24 hours  Intake/Output Summary (Last 24 hours) at 12/30/2022 0257 Last data filed at 12/30/2022 0039 Gross per 24 hour  Intake 200 ml  Output --  Net 200 ml    Labs/Imaging Results for orders placed or performed during the hospital encounter of 12/29/22 (from the past 48 hour(s))  Protime-INR     Status: None   Collection Time: 12/29/22 10:03 PM  Result Value Ref Range   Prothrombin Time 12.6 11.4 - 15.2 seconds   INR 0.9 0.8 - 1.2    Comment: (NOTE) INR goal varies based on device and disease states. Performed at [facility], [address] [place], [place] [address]   APTT     Status: None   Collection Time: 12/29/22 10:03 PM  Result Value Ref Range   aPTT 26 24 - 36 seconds    Comment: Performed at [facility], [address], [place], [place] [address]  CBC     Status: Abnormal   Collection Time: 12/29/22 10:03 PM  Result Value Ref Range   WBC 12.5 (H) 4.0 - 10.5 K/uL   RBC 4.83 3.87 - 5.11 MIL/uL   Hemoglobin 13.6 12.0 - 15.0 g/dL   HCT 95.2 84.1 - 32.4 %   MCV 88.2 80.0 - 100.0 fL   MCH 28.2 26.0 - 34.0 pg   MCHC 31.9 30.0 - 36.0 g/dL   RDW 13.6  11.5 - 15.5 %   Platelets 243 150 - 400 K/uL   nRBC 0.0 0.0 - 0.2 %    Comment: Performed at [facility], [address], [place], [place] [address]  Differential     Status: None   Collection Time: 12/29/22 10:03 PM  Result Value Ref Range   Neutrophils Relative % 61 %   Neutro Abs 7.7 1.7 - 7.7 K/uL   Lymphocytes Relative 32 %   Lymphs Abs 4.0 0.7 - 4.0 K/uL   Monocytes Relative 5 %   Monocytes Absolute 0.6 0.1 - 1.0 K/uL   Eosinophils Relative 1 %   Eosinophils Absolute 0.1 0.0 - 0.5 K/uL   Basophils Relative 1 %   Basophils Absolute 0.1 0.0 - 0.1 K/uL   Immature Granulocytes 0 %   Abs Immature Granulocytes 0.03 0.00 - 0.07 K/uL    Comment: Performed at [facility], [address] [place], [place] [address]  Comprehensive metabolic panel     Status: Abnormal   Collection  Time: 12/29/22 10:03 PM  Result Value Ref Range   Sodium 137 135 - 145 mmol/L   Potassium 3.9 3.5 - 5.1 mmol/L   Chloride 105 98 - 111 mmol/L   CO2 21 (L) 22 - 32 mmol/L   Glucose, Bld 357 (H) 70 - 99 mg/dL    Comment: Glucose reference range applies only to samples taken after fasting for at least 8 hours.   BUN 15 8 - 23 mg/dL   Creatinine, Ser 0.98 0.44 - 1.00 mg/dL   Calcium 9.1 8.9 - 11.9 mg/dL   Total Protein 6.8 6.5 - 8.1 g/dL   Albumin 3.7 3.5 - 5.0 g/dL   AST 26 15 - 41 U/L   ALT 35 0 - 44 U/L   Alkaline Phosphatase 83 38 - 126 U/L   Total Bilirubin 0.6 0.3 - 1.2 mg/dL   GFR, Estimated >14 >78 mL/min    Comment: (NOTE) Calculated using the CKD-EPI Creatinine Equation (2021)    Anion gap 11 5 - 15    Comment: Performed at [facility], [address] [place], [place] [address]  Ethanol     Status: None   Collection Time: 12/29/22 10:03 PM  Result Value Ref Range   Alcohol, Ethyl (B) <10 <10 mg/dL    Comment: (NOTE) Lowest detectable limit for serum alcohol is 10 mg/dL.  For medical purposes only. Performed at [facility], [address], [place], [place] [address]   CBG monitoring, ED     Status: Abnormal   Collection Time: 12/29/22 10:04 PM  Result Value Ref Range   Glucose-Capillary 335 (H) 70 - 99 mg/dL    Comment: Glucose reference range applies only to samples taken after fasting for at least 8 hours.  I-stat chem 8, ED     Status: Abnormal   Collection Time: 12/29/22 10:08 PM  Result Value Ref Range   Sodium 140 135 - 145 mmol/L   Potassium 3.8 3.5 - 5.1 mmol/L   Chloride 106 98 - 111 mmol/L   BUN 16 8 - 23 mg/dL   Creatinine, Ser 5.78 0.44 - 1.00 mg/dL   Glucose, Bld 469 (H) 70 - 99 mg/dL    Comment: Glucose reference range applies only to samples taken after fasting for at least 8 hours.   Calcium, Ion 1.17 1.15 - 1.40 mmol/L   TCO2 23 22 - 32 mmol/L   Hemoglobin 14.6 12.0 - 15.0  g/dL   HCT 40.9 81.1 - 91.4 %  CBG monitoring, ED      Status: Abnormal   Collection Time: 12/30/22  2:39 AM  Result Value Ref Range   Glucose-Capillary 277 (H) 70 - 99 mg/dL    Comment: Glucose reference range applies only to samples taken after fasting for at least 8 hours.   Comment 1 Notify RN    CT HEAD CODE STROKE WO CONTRAST  Result Date: 12/29/2022 CLINICAL DATA:  Code stroke. EXAM: CT HEAD WITHOUT CONTRAST TECHNIQUE: Contiguous axial images were obtained from the base of the skull through the vertex without intravenous contrast. RADIATION DOSE REDUCTION: This exam was performed according to the departmental dose-optimization program which includes automated exposure control, adjustment of the mA and/or kV according to patient size and/or use of iterative reconstruction technique. COMPARISON:  Prior study from 07/16/2021. FINDINGS: Brain: Cerebral volume within normal limits. Chronic right parieto-occipital encephalomalacia. No acute intracranial hemorrhage. No acute large vessel territory infarct. No mass lesion or midline shift. No hydrocephalus or extra-axial fluid collection. Vascular: No abnormal hyperdense vessel. Skull: Scalp soft tissues demonstrate no acute finding. Prior right posterior craniotomy. Sinuses/Orbits: Globes orbital soft tissues within normal limits. Paranasal sinuses and mastoid air cells are largely clear. Other: None. ASPECTS [facility] Stroke Program Early CT Score) - Ganglionic level infarction (caudate, lentiform nuclei, internal capsule, insula, M1-M3 cortex): 7 - Supraganglionic infarction (M4-M6 cortex): 3 Total score (0-10 with 10 being normal): 10 IMPRESSION: 1. No acute intracranial abnormality. 2. ASPECTS is 10. 3. Chronic right parieto-occipital encephalomalacia. These results were communicated to Dr. Otelia Limes at 10:32 pm on 12/29/2022 by text page via the [facility] messaging system. Electronically Signed   By: Rise Mu M.D.   On: 12/29/2022 22:34    Pending Labs Unresulted Labs (From admission, onward)      Start     Ordered   12/30/22 0156  Hemoglobin A1c  Once,   URGENT       Comments: To assess prior glycemic control    12/30/22 0155            Vitals/Pain Today's Vitals   12/29/22 2305 12/29/22 2315 12/29/22 2330 12/29/22 2345  BP:  (!) 144/80 (!) 131/92 (!) 146/83  Pulse:  (!) 104 (!) 109 (!) 107  Resp:  (!) 32 19   SpO2:  100% 100% 100%  Weight:      PainSc: 10-Worst pain ever       Isolation Precautions No active isolations  Medications Medications  levETIRAcetam (KEPPRA) tablet 500 mg (has no administration in time range)  insulin aspart (novoLOG) injection 0-15 Units (has no administration in time range)  insulin aspart (novoLOG) injection 0-5 Units (3 Units Subcutaneous Given 12/30/22 0242)  losartan (COZAAR) tablet 50 mg (has no administration in time range)  insulin NPH Human (NOVOLIN N) injection 45 Units (has no administration in time range)  sodium chloride flush (NS) 0.9 % injection 3 mL (3 mLs Intravenous Given 12/29/22 2247)  levETIRAcetam (KEPPRA) IVPB 1500 mg/ 100 mL premix (0 mg Intravenous Stopped 12/30/22 0039)    Followed by  levETIRAcetam (KEPPRA) IVPB 1500 mg/ 100 mL premix (0 mg Intravenous Stopped 12/29/22 2324)  acetaminophen (TYLENOL) tablet 650 mg (650 mg Oral Given 12/30/22 0004)  prochlorperazine (COMPAZINE) injection 10 mg (10 mg Intravenous Given 12/30/22 0127)    Mobility walks with person assist     Focused Assessments     R Recommendations: See Admitting Provider Note  Report given to:  Additional Notes: Spanish Speaking only

## 2022-12-30 NOTE — Progress Notes (Signed)
Stat  EEG complete - results pending.  

## 2022-12-30 NOTE — Progress Notes (Signed)
Patient refused keppra,RN educate the importance of taking Keppra as she recently had seizure but patient state it makes her drowsy and she does not take at her home,MD notified

## 2022-12-30 NOTE — Discharge Summary (Signed)
Physician Discharge Summary   [name]: Stephanie [name] MRN: [id number] DOB: 1960/12/03  Admit date:     12/29/2022  Discharge date: 12/30/22  Discharge Physician: Alberteen Sam   PCP: Default, Provider, MD     Recommendations at discharge:  Follow up with Neurology on new Depakote     Discharge Diagnoses: Principal Problem:   Seizure [facility] Active Problems:   Type 2 diabetes mellitus without complication, with long-term current use of insulin (HCC)   HTN (hypertension)   History of subdural hematoma   Morbid Obesity BMI 40     Hospital Course: Stephanie [name] is a 62 y.o. F with history of cerebral abscess, history of right subdural hematoma status postcraniotomy 2019, and morbid obesity, DM, and HTN who presented for episodes of shaking.  Family were going to dinner when [name] had sudden onset left-sided weakness, this was paroxysmal, and associated with some jerking.  EMS noted transient left arm tonic flexion and head turn to Stephanie left lasting for several seconds, after which Stephanie [name] was poorly responsive/postictal and complained of headache.  In Stephanie ER, CT head unremarkable, neurology consulted.      Seizure Stephanie [name] was observed overnight and had no further episodes.  Neurology were consulted, obtained EEG which showed cortical dysfunction arising from Stephanie right parieto-occipital region.  They discussed Keppra which Stephanie [name] had side effects to and so elected to start Depakote.  Discharged with new Neurology follow up closer to home here in [place].         Stephanie [facility] Controlled Substances Registry was reviewed for this [name] prior to discharge.   Consultants: Neurology Procedures performed: EEG, CT head  Disposition: Home Diet recommendation:  Cardiac diet  DISCHARGE MEDICATION: Allergies as of 12/30/2022   No Known Allergies      Medication List     STOP taking these medications    ascorbic acid 500 MG  tablet Commonly known as: VITAMIN C   guaiFENesin-dextromethorphan 100-10 MG/5ML syrup Commonly known as: ROBITUSSIN DM   zinc sulfate 220 (50 Zn) MG capsule       TAKE these medications    acetaminophen 325 MG tablet Commonly known as: TYLENOL Take 650 mg by mouth every 6 (six) hours as needed for mild pain. Notes to [name]: Tome 650 mg por va oral cada 6 (seis) horas segn sea necesario para el dolor leve.   albuterol 108 (90 Base) MCG/ACT inhaler Commonly known as: VENTOLIN HFA Inhale 2 puffs into Stephanie lungs every 6 (six) hours as needed for wheezing or shortness of breath. Notes to [name]: Inhale 2 inhalaciones hacia los pulmones cada 6 (seis) horas segn sea necesario para las sibilancias o la dificultad para respirar.   Blood Glucose Monitoring Suppl Devi 1 each by Does not apply route 3 (three) times daily. May dispense any manufacturer covered by [name]'s insurance.   BLOOD GLUCOSE TEST STRIPS Strp 1 each by Does not apply route 3 (three) times daily. Use as directed to check blood sugar. May dispense any manufacturer covered by [name]'s insurance and fits [name]'s device.   divalproex 500 MG DR tablet Commonly known as: DEPAKOTE Take 1 tablet (500 mg total) by mouth every 12 (twelve) hours.   insulin NPH Human 100 UNIT/ML injection Commonly known as: NOVOLIN N Inject 0.2 mLs (20 Units total) into Stephanie skin 2 (two) times daily before a meal.   Insulin Syringe-Needle U-100 25G X 5/8" 1 ML Misc 1 each by Does not apply route 3 (three) times  daily. May dispense any manufacturer covered by [name]'s insurance.   Lancet Device Misc 1 each by Does not apply route 3 (three) times daily. May dispense any manufacturer covered by [name]'s insurance.   losartan 50 MG tablet Commonly known as: COZAAR Take 50 mg by mouth daily.   sitaGLIPtin-metformin 50-1000 MG tablet Commonly known as: JANUMET Take 1 tablet by mouth 2 (two) times daily with a meal. Notes to  [name]: Tome 1 tableta por va oral 2 (dos) veces al da con una comida.        Follow-up Information      NEUROLOGY. Schedule an appointment as soon as possible for a visit in 4 week(s).   Why: Please call and make an appointment for 4 weeks after discharge Contact information: [address] [place], [address] [place] [address] [phone]        Truddie Hidden, MD Follow up.   Specialty: Neurology Contact information: [address] [place] [address] [phone]         Your primary care doctor Follow up in 1 week(s).                  Discharge Instructions     Ambulatory referral to Neurology   Complete by: As directed    An appointment is requested in approximately: 4 weeks   Discharge instructions   Complete by: As directed    **IMPORTANT DISCHARGE INSTRUCTIONS**   From Dr. Maryfrances Bunnell: You were admitted for seizure like activity. Here, you had an EEG (electrical activity of Stephanie brain) that showed an abnormal area, which may be provoking seizures.  You should restart an anti-seizure medicine Take depakote 500 mg twice daily Depakote can cause mild diarrhea or sleepiness but usually causes no side effects.  If you have problems with side effects, call Dr. Byrd Hesselbach  Call your Neurologist Dr. Byrd Hesselbach for an appointment in 2-4 weeks We have also sent a referral to [facility] in [place], if you would like to see them instead.  If not, just cancel that appointment when they call you to arrange follow up  Go see your primary doctor in 1 week  We have resumed your NPH insulin, please take this twice daily  Check your blood sugar twice daily at least and bring Stephanie log to your primary doctor's office when you go there.   Increase activity slowly   Complete by: As directed        Discharge Exam: Filed Weights   12/29/22 2200  Weight: 119.4 kg    General: Pt is alert, awake, not in acute  distress Cardiovascular: RRR, nl S1-S2, no murmurs appreciated.   No LE edema.   Respiratory: Normal respiratory rate and rhythm.  CTAB without rales or wheezes. Abdominal: Abdomen soft and non-tender.  No distension or HSM.   Neuro/Psych: Strength symmetric in upper and lower extremities.  Judgment and insight appear normal.  All history collected via videophonic interpreter   Condition at discharge: fair  Stephanie results of significant diagnostics from this hospitalization (including imaging, microbiology, ancillary and laboratory) are listed below for reference.   Imaging Studies: EEG adult  Result Date: 01-07-23 Charlsie Quest, MD     2023-01-07  7:56 AM [name] Name: Nyhla Salter MRN: [id number] Epilepsy Attending: Charlsie Quest Referring Physician/Provider: Caryl Pina, MD Date: 01/07/23 Duration: 27.22 mins [name] history: 62 year old female with a PMHx of cerebral right parieto-occipital abscess, brain surgery 4 years ago for  right subdural hematoma evacuation (Jan 2019 craniotomy), DM2 and HTN who presents to Stephanie ED via EMS as a Code Stroke after acute onset of left sided weakness while riding in Stephanie [name] husbands car as a passenger while on Stephanie way to a family dinner. EMS witnessed left-sided motor activity x 2, Stephanie second with leftward head turning, left eye deviation and speech arrest that were suggestive of seizure activity on scene. During acute stroke evaluation in CT Stephanie [name] experienced a third spell, appearing most consistent with a partial-complex seizure.  EEG to evaluate for seizure. Level of alertness: Awake, asleep AEDs during EEG study: LEV Technical aspects: This EEG study was done with scalp electrodes positioned according to Stephanie 10-20 International system of electrode placement. Electrical activity was reviewed with band pass filter of 1-70Hz , sensitivity of 7 uV/mm, display speed of 16mm/sec with a 60Hz  notched filter applied as appropriate. EEG data were recorded  continuously and digitally stored.  Video monitoring was available and reviewed as appropriate. Description: Stephanie posterior dominant rhythm consists of 8 Hz activity of moderate voltage (25-35 uV) seen predominantly in posterior head regions, symmetric and reactive to eye opening and eye closing. Sleep was characterized by vertex waves, sleep spindles (12 to 14 Hz), maximal frontocentral region. EEG showed continuous 3 to 6 Hz theta-delta slowing in right parieto-occipital region. Physiologic photic driving was not seen during photic stimulation. Hyperventilation was not performed.   ABNORMALITY - Continuous slow, right parieto-occipital region IMPRESSION: This study is suggestive of cortical dysfunction arising from right parieto-occipital region likely secondary to underlying structural abnormality. No seizures or epileptiform discharges were seen throughout Stephanie recording. Please note lack of epileptform activity during interictal EEG does not exclude Stephanie diagnosis of epilepsy. Dr Otelia Limes was notified. Charlsie Quest   CT HEAD CODE STROKE WO CONTRAST  Result Date: 12/29/2022 CLINICAL DATA:  Code stroke. EXAM: CT HEAD WITHOUT CONTRAST TECHNIQUE: Contiguous axial images were obtained from Stephanie base of Stephanie skull through Stephanie vertex without intravenous contrast. RADIATION DOSE REDUCTION: This exam was performed according to Stephanie departmental dose-optimization program which includes automated exposure control, adjustment of the mA and/or kV according to [name] size and/or use of iterative reconstruction technique. COMPARISON:  Prior study from 07/16/2021. FINDINGS: Brain: Cerebral volume within normal limits. Chronic right parieto-occipital encephalomalacia. No acute intracranial hemorrhage. No acute large vessel territory infarct. No mass lesion or midline shift. No hydrocephalus or extra-axial fluid collection. Vascular: No abnormal hyperdense vessel. Skull: Scalp soft tissues demonstrate no acute finding. Prior  right posterior craniotomy. Sinuses/Orbits: Globes orbital soft tissues within normal limits. Paranasal sinuses and mastoid air cells are largely clear. Other: None. ASPECTS [facility] Stroke Program Early CT Score) - Ganglionic level infarction (caudate, lentiform nuclei, internal capsule, insula, M1-M3 cortex): 7 - Supraganglionic infarction (M4-M6 cortex): 3 Total score (0-10 with 10 being normal): 10 IMPRESSION: 1. No acute intracranial abnormality. 2. ASPECTS is 10. 3. Chronic right parieto-occipital encephalomalacia. These results were communicated to Dr. Otelia Limes at 10:32 pm on 12/29/2022 by text page via Stephanie [facility] messaging system. Electronically Signed   By: Rise Mu M.D.   On: 12/29/2022 22:34    Microbiology: No results found for this or any previous visit.  Labs: CBC: Recent Labs  Lab 12/29/22 2203 12/29/22 2208 12/30/22 0654  WBC 12.5*  --  11.7*  NEUTROABS 7.7  --   --   HGB 13.6 14.6 12.2  HCT 42.6 43.0 37.4  MCV 88.2  --  87.0  PLT 243  --  227   Basic Metabolic Panel: Recent Labs  Lab 12/29/22 2203 12/29/22 2208 12/30/22 0654  NA 137 140 137  K 3.9 3.8 3.6  CL 105 106 106  CO2 21*  --  24  GLUCOSE 357* 362* 229*  BUN 15 16 10   CREATININE 0.76 0.60 0.63  CALCIUM 9.1  --  8.6*   Liver Function Tests: Recent Labs  Lab 12/29/22 2203  AST 26  ALT 35  ALKPHOS 83  BILITOT 0.6  PROT 6.8  ALBUMIN 3.7   CBG: Recent Labs  Lab 12/30/22 0239 12/30/22 0649 12/30/22 0825 12/30/22 1126 12/30/22 1612  GLUCAP 277* 219* 200* 224* 251*    Discharge time spent: approximately 35 minutes spent on discharge counseling, evaluation of [name] on day of discharge, and coordination of discharge planning with nursing, social work, pharmacy and case management  Signed: Alberteen Sam, MD [facility] 12/30/2022

## 2022-12-30 NOTE — H&P (Addendum)
PCP:   Default, Provider, MD   Chief Complaint:  Confusion  HPI: This is a 62 year old female with PMH of T2DM, HTN, cerebral right parieto-occipital abscess, right SDH with evacuation 4 years ago.  Patient underwent craniotomy twice during the hospitalization 10 days apart.  Today patient was out with her husband.  She tried to get out of the car, initially it was difficult and when she eventually did she walk the opposite way.  He noted that her left arm was stiff and she could not get her right foot to work properly.  The patient then blacked out.  She did not pass out but she was not comprehending or following commands, for example TC move the left arm, she would say yes but she did not follow directions. 911 was called.  This was a second episode such an event occurred.  The first time was in January 2022.  She was in the restaurant, saw flashes from the left eyes and developed a headache.  She went to the car and again 'blacked out'.  She has never recall those events.  She was taken to [facility].  EEG done was negative.  In route to the hospital today the paramedics noted abnormal movements, somewhat spastic in nature.  She does not recall today's events.  Her last memory was of not being able to sit comfortably in the car with her husband.  Her headache is since resolved.  She denies any prodromal sick symptoms including fever, chills, nausea, vomiting, diarrhea, cough, shortness of breath or burning urination. Patient came in as a code stroke.  Neurology has seen patient in the ED.  Patient loaded with Keppra.  Family speaking female.  Translation and history provided by patient's daughter present at bedside  Review of Systems:  Per HPI  Past Medical History: Past Medical History:  Diagnosis Date   Cerebral abscess 2019   Diabetes mellitus, type 2 (HCC)    HTN (hypertension)    Past Surgical History:  Procedure Laterality Date   CRANIOTOMY  2019    Medications: Prior  to Admission medications   Medication Sig Start Date End Date Taking? Authorizing Provider  albuterol (VENTOLIN HFA) 108 (90 Base) MCG/ACT inhaler Inhale 2 puffs into the lungs every 6 (six) hours as needed for wheezing or shortness of breath. 07/02/19   Meredeth Ide, MD  ascorbic acid (VITAMIN C) 500 MG tablet Take 1 tablet (500 mg total) by mouth daily. 07/03/19   Meredeth Ide, MD  guaiFENesin-dextromethorphan (ROBITUSSIN DM) 100-10 MG/5ML syrup Take 5 mLs by mouth every 4 (four) hours as needed for cough. 07/02/19   Meredeth Ide, MD  insulin NPH Human (NOVOLIN N) 100 UNIT/ML injection Inject 50 units subcutaneously every morning and 45 units at night 07/07/19   Marcine Matar, MD  losartan (COZAAR) 50 MG tablet Take 50 mg by mouth daily.    [provider]  sitaGLIPtin-metformin (JANUMET) 50-1000 MG tablet Take 1 tablet by mouth 2 (two) times daily with a meal.    [provider]  zinc sulfate 220 (50 Zn) MG capsule Take 1 capsule (220 mg total) by mouth daily. 07/03/19   Meredeth Ide, MD    Allergies:  No Known Allergies  Social History:  reports that she has never smoked. She has never used smokeless tobacco. She reports that she does not drink alcohol. No history on file for drug use.  Family History: History reviewed. No pertinent family history.  Physical Exam:  Vitals:   12/29/22 2300 12/29/22 2315 12/29/22 2330 12/29/22 2345  BP: (!) 148/57 (!) 144/80 (!) 131/92 (!) 146/83  Pulse: (!) 108 (!) 104 (!) 109 (!) 107  Resp: 17 (!) 32 19   SpO2: 93% 100% 100% 100%  Weight:        General:  Alert and oriented times three, well developed and nourished, no acute distress Eyes: Pink conjunctiva, no scleral icterus ENT: Moist oral mucosa, neck supple, no thyromegaly Lungs: clear to ascultation, no use of accessory muscles Cardiovascular: regular rate and rhythm, no carotid bruits, no JVD Abdomen: soft, positive BS, non-tender, non-distended, no organomegaly, not  an acute abdomen GU: not examined Neuro: CN II - XII grossly intact, sensation intact Musculoskeletal: strength 5/5 all extremities, no clubbing, cyanosis or edema Skin: no rash, no subcutaneous crepitation, no decubitus Psych: appropriate patient   Labs on Admission:  Recent Labs    12/29/22 2203 12/29/22 2208  NA 137 140  K 3.9 3.8  CL 105 106  CO2 21*  --   GLUCOSE 357* 362*  BUN 15 16  CREATININE 0.76 0.60  CALCIUM 9.1  --    Recent Labs    12/29/22 2203  AST 26  ALT 35  ALKPHOS 83  BILITOT 0.6  PROT 6.8  ALBUMIN 3.7   Recent Labs    12/29/22 2203 12/29/22 2208  WBC 12.5*  --   NEUTROABS 7.7  --   HGB 13.6 14.6  HCT 42.6 43.0  MCV 88.2  --   PLT 243  --     Radiological Exams on Admission: CT HEAD CODE STROKE WO CONTRAST  Result Date: 12/29/2022 CLINICAL DATA:  Code stroke. EXAM: CT HEAD WITHOUT CONTRAST TECHNIQUE: Contiguous axial images were obtained from the base of the skull through the vertex without intravenous contrast. RADIATION DOSE REDUCTION: This exam was performed according to the departmental dose-optimization program which includes automated exposure control, adjustment of the mA and/or kV according to patient size and/or use of iterative reconstruction technique. COMPARISON:  Prior study from 07/16/2021. FINDINGS: Brain: Cerebral volume within normal limits. Chronic right parieto-occipital encephalomalacia. No acute intracranial hemorrhage. No acute large vessel territory infarct. No mass lesion or midline shift. No hydrocephalus or extra-axial fluid collection. Vascular: No abnormal hyperdense vessel. Skull: Scalp soft tissues demonstrate no acute finding. Prior right posterior craniotomy. Sinuses/Orbits: Globes orbital soft tissues within normal limits. Paranasal sinuses and mastoid air cells are largely clear. Other: None. ASPECTS [facility] Stroke Program Early CT Score) - Ganglionic level infarction (caudate, lentiform nuclei, internal capsule,  insula, M1-M3 cortex): 7 - Supraganglionic infarction (M4-M6 cortex): 3 Total score (0-10 with 10 being normal): 10 IMPRESSION: 1. No acute intracranial abnormality. 2. ASPECTS is 10. 3. Chronic right parieto-occipital encephalomalacia. These results were communicated to Dr. Otelia Limes at 10:32 pm on 12/29/2022 by text page via the [facility] messaging system. Electronically Signed   By: Rise Mu M.D.   On: 12/29/2022 22:34    Assessment/Plan Present on Admission: Possible seizure -Neuro consult placed -Patient loaded with IV Keppra 3000 mg -Keppra 500 mg twice daily initiated by neurology -EEG ordered -Patient with prior brain injury -CT head without acute findings.  Code stroke canceled -UDS, UA ordered.  T2DM -Sign scale insulin -NPH resumed  HTN -Resume Cozaar  Morbid obesity -Patient is never been studied for sleep apnea  H/o brain abscess and right-sided SDH 2019  Ebb Carelock 12/30/2022, 1:45 AM

## 2022-12-30 NOTE — Progress Notes (Addendum)
Neurology Progress Note   S:// Patient is laying in the bed sleeping in NAD. Husband and daughter at the bedside. Patient  states she will not take Keppra because she had been on it in the past and she had side effects from it including being irritable, anxious and sleepy. She and daughter state that her doctor took her off Keppra about a year ago due to no seizures and side effects. She has a neurologist in [place] and daughter is requesting for a referral for a neurologist closer that is closer than [facility]   EEG This study is suggestive of cortical dysfunction arising from right parieto-occipital region likely secondary to underlying structural abnormality. No seizures or epileptiform discharges were seen throughout the recording.   Daughter states she has ~3 episodes of unresponsiveness and confusion and flashing lights.   Will discontinue Keppra and start Depakote 500mg  BID.  O:// Current vital signs: BP 137/70 (BP Location: Right Arm)   Pulse 80   Temp 97.9 F (36.6 C) (Axillary)   Resp 14   Wt 119.4 kg   SpO2 93%   BMI 40.02 kg/m  Vital signs in last 24 hours: Temp:  [97.9 F (36.6 C)-98 F (36.7 C)] 97.9 F (36.6 C) (06/30 1123) Pulse Rate:  [76-109] 80 (06/30 1123) Resp:  [14-32] 14 (06/30 1123) BP: (122-148)/(53-92) 137/70 (06/30 1123) SpO2:  [93 %-100 %] 93 % (06/30 1123) Weight:  [119.4 kg] 119.4 kg (06/29 2200)  GENERAL: Awake, alert in NAD HEENT: - Normocephalic and atraumatic, dry mm LUNGS - Clear to auscultation bilaterally with no wheezes CV - S1S2 RRR, no m/r/g, equal pulses bilaterally. ABDOMEN - obese, rounded, Soft, nontender, nondistended with normoactive BS Ext: warm, well perfused, intact peripheral pulses, no edema  NEURO:  Mental Status: AA&Ox3  Language: speech is clear.  Naming, repetition, fluency, and comprehension intact. Cranial Nerves: PERRL ____mm/brisk. EOMI, visual fields full, no facial asymmetry, facial sensation intact,  hearing intact, tongue/uvula/soft palate midline, normal sternocleidomastoid and trapezius muscle strength. No evidence of tongue atrophy or fibrillations Motor: 5/5 in all 4 extremities Tone: is normal and bulk is normal Sensation- Intact to light touch bilaterally Coordination: FTN intact bilaterally, no ataxia in BLE. Gait- deferred   Medications  Current Facility-Administered Medications:    acetaminophen (TYLENOL) tablet 650 mg, 650 mg, Oral, Q6H PRN **OR** acetaminophen (TYLENOL) suppository 650 mg, 650 mg, Rectal, Q6H PRN, Crosley, Debby, MD   heparin injection 5,000 Units, 5,000 Units, Subcutaneous, Q8H, Crosley, Debby, MD, 5,000 Units at 12/30/22 0704   insulin aspart (novoLOG) injection 0-15 Units, 0-15 Units, Subcutaneous, TID WC, Crosley, Debby, MD, 5 Units at 12/30/22 0704   insulin aspart (novoLOG) injection 0-5 Units, 0-5 Units, Subcutaneous, QHS, Crosley, Debby, MD, 3 Units at 12/30/22 0242   insulin aspart (novoLOG) injection 5 Units, 5 Units, Subcutaneous, TID WC, Danford, Earl Lites, MD, 5 Units at 12/30/22 0935   insulin NPH Human (NOVOLIN N) injection 45 Units, 45 Units, Subcutaneous, BID AC, Crosley, Debby, MD, 45 Units at 12/30/22 0932   levETIRAcetam (KEPPRA) tablet 500 mg, 500 mg, Oral, BID, Caryl Pina, MD   LORazepam (ATIVAN) injection 2 mg, 2 mg, Intravenous, Once PRN, Crosley, Debby, MD   losartan (COZAAR) tablet 50 mg, 50 mg, Oral, Daily, Crosley, Debby, MD, 50 mg at 12/30/22 0935   ondansetron (ZOFRAN) tablet 4 mg, 4 mg, Oral, Q6H PRN **OR** ondansetron (ZOFRAN) injection 4 mg, 4 mg, Intravenous, Q6H PRN, Gery Pray, MD  Labs CBC    Component  Value Date/Time   WBC 11.7 (H) 12/30/2022 0654   RBC 4.30 12/30/2022 0654   HGB 12.2 12/30/2022 0654   HCT 37.4 12/30/2022 0654   PLT 227 12/30/2022 0654   MCV 87.0 12/30/2022 0654   MCH 28.4 12/30/2022 0654   MCHC 32.6 12/30/2022 0654   RDW 13.7 12/30/2022 0654   LYMPHSABS 4.0 12/29/2022 2203   MONOABS  0.6 12/29/2022 2203   EOSABS 0.1 12/29/2022 2203   BASOSABS 0.1 12/29/2022 2203    CMP     Component Value Date/Time   NA 137 12/30/2022 0654   K 3.6 12/30/2022 0654   CL 106 12/30/2022 0654   CO2 24 12/30/2022 0654   GLUCOSE 229 (H) 12/30/2022 0654   BUN 10 12/30/2022 0654   CREATININE 0.63 12/30/2022 0654   CALCIUM 8.6 (L) 12/30/2022 0654   PROT 6.8 12/29/2022 2203   ALBUMIN 3.7 12/29/2022 2203   AST 26 12/29/2022 2203   ALT 35 12/29/2022 2203   ALKPHOS 83 12/29/2022 2203   BILITOT 0.6 12/29/2022 2203   GFRNONAA >60 12/30/2022 0654   GFRAA >60 07/01/2019 1446    Lipid Panel  No results found for: "CHOL", "TRIG", "HDL", "CHOLHDL", "VLDL", "LDLCALC", "LDLDIRECT"  Lab Results  Component Value Date   HGBA1C 9.2 (H) 06/23/2019     Imaging I have reviewed images in epic and the results pertinent to this consultation are:  CT-scan of the brain- No acute intracranial abnormality. ASPECTS is 10. Chronic right parieto-occipital encephalomalacia.  Assessment:  62 year old female with a PMHx of cerebral right parieto-occipital abscess, brain surgery 4 years ago for right subdural hematoma evacuation (Jan 2019 craniotomy), DM2 and HTN who presents to the ED via EMS as a Code Stroke after acute onset of left sided weakness while riding in her husbands car as a passenger while on the way to a family dinner. EMS witnessed left-sided motor activity x 2, the second with leftward head turning, left eye deviation and speech arrest that were suggestive of seizure activity on scene. During acute stroke evaluation in CT she experienced a third spell, appearing most consistent with a partial-complex seizure.    Impression: Seizures   Recommendations: - Seizure precautions  - Will discontinue Keppra - Depakote 500 mg BID - Neurology will continue to follow  - Will need outpatient follow up with neurology  after discharge - referral sent  SEIZURE PRECAUTIONS Per [facility]  statutes, patients with seizures are not allowed to drive until they have been seizure-free for six months.   Use caution when using heavy equipment or power tools. Avoid working on ladders or at heights. Take showers instead of baths. Ensure the water temperature is not too high on the home water heater. Do not go swimming alone. Do not lock yourself in a room alone (i.e. bathroom). When caring for infants or small children, sit down when holding, feeding, or changing them to minimize risk of injury to the child in the event you have a seizure. Maintain good sleep hygiene. Avoid alcohol.    If patient has another seizure, call 911 and bring them back to the ED if: A.  The seizure lasts longer than 5 minutes.      B.  The patient doesn't wake shortly after the seizure or has new problems such as difficulty seeing, speaking or moving following the seizure C.  The patient was injured during the seizure D.  The patient has a temperature over 102 F (39C) E.  The  patient vomited during the seizure and now is having trouble breathing  Gevena Mart DNP, ACNPC-AG  [facility]   I have seen the patient and reviewed the above note.  She has had difficulties with Keppra in the past, and therefore wishes to try an alternative, I think Depakote would be a reasonable alternative.  She will need outpatient follow-up.  Neurology will be available as needed.   Ritta Slot, MD [facility] [phone]  If 7pm- 7am, please page neurology on call as listed in AMION.

## 2022-12-30 NOTE — Procedures (Signed)
Patient Name: Stephanie [name]  MRN: [id number]  Epilepsy Attending: Charlsie Quest  Referring Physician/Provider: Caryl Pina, MD  Date: 12/30/2022 Duration: 27.22 mins  Patient history: 62 year old female with a PMHx of cerebral right parieto-occipital abscess, brain surgery 4 years ago for right subdural hematoma evacuation (Jan 2019 craniotomy), DM2 and HTN who presents to the ED via EMS as a Code Stroke after acute onset of left sided weakness while riding in her husbands car as a passenger while on the way to a family dinner. EMS witnessed left-sided motor activity x 2, the second with leftward head turning, left eye deviation and speech arrest that were suggestive of seizure activity on scene. During acute stroke evaluation in CT she experienced a third spell, appearing most consistent with a partial-complex seizure.  EEG to evaluate for seizure.   Level of alertness: Awake, asleep  AEDs during EEG study: LEV  Technical aspects: This EEG study was done with scalp electrodes positioned according to the 10-20 International system of electrode placement. Electrical activity was reviewed with band pass filter of 1-70Hz , sensitivity of 7 uV/mm, display speed of 51mm/sec with a 60Hz  notched filter applied as appropriate. EEG data were recorded continuously and digitally stored.  Video monitoring was available and reviewed as appropriate.  Description: The posterior dominant rhythm consists of 8 Hz activity of moderate voltage (25-35 uV) seen predominantly in posterior head regions, symmetric and reactive to eye opening and eye closing. Sleep was characterized by vertex waves, sleep spindles (12 to 14 Hz), maximal frontocentral region. EEG showed continuous 3 to 6 Hz theta-delta slowing in right parieto-occipital region. Physiologic photic driving was not seen during photic stimulation. Hyperventilation was not performed.     ABNORMALITY - Continuous slow, right parieto-occipital  region  IMPRESSION: This study is suggestive of cortical dysfunction arising from right parieto-occipital region likely secondary to underlying structural abnormality. No seizures or epileptiform discharges were seen throughout the recording.  Please note lack of epileptform activity during interictal EEG does not exclude the diagnosis of epilepsy.  Dr Otelia Limes was notified.   [name]

## 2022-12-30 NOTE — Hospital Course (Signed)
type 2 diabetes mellitus, hypertension, morbid obesity , cerebral right parieto-occipital abscess, brain surgery 4 years ago for right subdural hematoma evacuation (Jan 2019 craniotomy),

## 2022-12-31 LAB — HEMOGLOBIN A1C
Hgb A1c MFr Bld: 11.6 % — ABNORMAL HIGH (ref 4.8–5.6)
Mean Plasma Glucose: 286 mg/dL

## 2023-05-10 NOTE — Progress Notes (Signed)
 [facility] NEUROLOGY CLINIC [address] [place] [address] [place] [address] [phone]  Entire visit conducted with PI as interpreter.   Date: 05/10/2023  Patient Name: Stephanie [name]  MRN: [id number]  PCP: Janell Hadassah Ewings Referring Provider: Berkley Cheryle Fairy Ana*  Assessment:    Stephanie [name] is a 62 y.o. female presenting for  1. Focal epilepsy (CMS-HCC)   2. Encounter for medication management    #Focal epilepsy Previously pt had likely seizure in setting of acute abscess.  Then patient had recurrent seizure in 12/2022.  Unclear if this was due to running out of medication or still taking medication.  We had previously discussed weaning off ASM, but pt reported still taking it at that time.  However, at this point, pt is at high risk of recurrent seizures and needs long-term ASM therapy.    Given history of DM/ metabolic syndrome and morbid obesity, VPA isn't a great choice.  This is compounded by difficulty obtaining labs locally and coordinating blood work with local health clinic.  At this point, I recommend cross-titrating back to keppra  on a higher dose.  Also discussed lamotrigine (for which regular blood work would be more important) and lacosamide (which may be cost-prohibitive).  Of note:  Discussion limited by pt comprehension despite entire conversation being conducted via spanish interpreter.  I spent 45 minutes with the patient on the date of service. I spent an additional 10 minutes on pre- and post-visit activities on the date of service.    Plan:    Patient Instructions  Start taking Keppra  750mg  twice a day Continue taking Divalproex  ER 500mg  twice a day for now After you have been taking the new dose of Keppra  for 2-4 weeks, we can start decreasing Divalproex  At your [facility], please obtain the following bloodwork: CBC CMP Divalproex  level If you have another seizure when  changing medications please call our clinic Follow-up with Corean Bailer in 2-4 weeks  You may need to see her 1-2 more times until you see me again. Follow-up with me in 4-6 months.    Subjective:    HPI: Stephanie [name] is a 62 y.o. female who is being seen at the [facility] Neurology Department at the request of Dr. Janell for evaluation of seizures  Interval History 05/10/23 Pt reported another episode when had to go to [facility] hospital.  Pt was amnestic to event.  Currently been taking Divalproex  ER 500mg  BID.  This tab doesn't cause depression like Keppra .  12/2022 was in [facility] health for recurrent seizure like activity.  Per chart, pt had sudden onset left-sided weakness, this was paroxysmal, and associated with some jerking. EMS noted transient left arm tonic flexion and head turn to the left lasting for several seconds, after which she was poorly responsive/postictal and complained of headache.  Interval History (05/18/22) Last visit, we weaned off Keppra .  Pt reported having panic attacks on Keppra .  Since weaning off, pt reports less panic attacks but more sadness.  No further seizure like events since weaning off Keppra .  Now pt, feels like she is back to baseline prior to Keppra .  Now having burning pain in back and chest associated with sadness.  This lasts for a couple of seconds and a couple times per day.  This is also associated with loss of flavor in mouth.    Pt still has episodes of flashing light in left eye, but  these are not associated with headache.  These only last a few seconds, particularly when looking at something bright.    Pt denies any other neurologic issues.  Burning sensation Most likely associated with depression/ anxiety, particularly related to losing job and having to stay at home all day.  Husband is gone most of day from working.   Interval History Pt reports that she has been feeling normal.  She reports  having a lump on the right side, but it went away.  Reviewed CMP, CBC, LEV level with patient.  Also discussed EEG results.  Pt also reports chills going over body to back.  Pt also reports episodes of panic/ sadness all over body almost daily.   These occur either morning when waking or when going to bed.    Pt also reports that her taste has changed following this larger event in January.  Still having events of left vision change lasting seconds.    Prior History Referred here for epileptic seizures.  Seizure started 07/17/2021.  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  Was seen at [facility] where the abscess was excised x2, culture positive for K pneumonia.  Had a right arm midline for prolonged antibiotics, subsequently removed.    Pt was having flashlight in left eye before and after the surgery in 2019.  Now still rarely gets this left eye light sensation lasting 1-2 hours before going away.  Pt reported having diminished left eye lateral vision subsequent to cerebral abscess and evacuation.    On 07/17/2021, this was only 1 event, described as her legs not being able to support her weight.  Pt reported going to the bathroom, then patient lost consciousness.  Per chart review, daughter reported that she had convulsion, perhaps in ED.  +/- hand contraction, left facial droop.  +bowel/ bladder incontinence; no tongue biting.  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  Since surgery/ abscess, pt reports that she has had persistent difficulty walking, now walking with walker.    Pt had suicidality and severe anxiety when first starting Keppra , but she reports that has resolved with Valerian root and body habituation to Keppra .    Seizure Types: Unclear  Age of Seizure Onset:  62 yo  Seizure Risk  Factors: Preterm, Prenatal Injury, Congenital Malformation: No Febrile Seizures:    No Family History of Epilepsy:   No Ischemic stroke:   No Hemorrhagic Stroke:   No Traumatic brain injury:  No Brain Tumor   No Meningitis/ Encephalitis:  Yes, cerebral abscess in 2019 Neurodegenerative disease:  No Childhood trauma/ abuse:  No Adult trauma/ abuse :   No  CURRENT ASMs: VPA ER 500mg  BID  PRIOR ASMs:  LEV 500mg  BID  Driving:  no Employed:  Not asked Contraception:  Not asked  PRIOR EVALUATION: MRI Brain / Head CT:  Reviewed in chart  EEG: 12/2022 rEEG OSH - Continuous slow, right parieto-occipital region   EMU: n/a  PET: n/a  Neuropsych: n/a   SPECT: n/a  Other Studies: N/A  CURRENT MEDICATIONS: Current Outpatient Medications  Medication Sig Dispense Refill  . divalproex  (DEPAKOTE ) 500 MG DR tablet Take 1 tablet (500 mg total) by mouth.    . insulin  NPH (HUMULIN ,NOVOLIN) 100 unit/mL injection Inject 0.25 mL (25 Units total) under the skin daily before breakfast. Frequency:PHARMDIR   Dosage:0.0  Instructions:  Note: 25 units in the AM and 20 units at PM. Dose: 15U/10U    . rosuvastatin (CRESTOR) 20 MG tablet Take 1 tablet (20 mg total) by mouth daily.    SABRA escitalopram oxalate (LEXAPRO) 10 MG tablet Take 1 tablet (10 mg total) by mouth daily. (Patient not taking: Reported on 05/10/2023) 90 tablet 3  . levETIRAcetam  (KEPPRA ) 750 MG tablet Take 1 tablet (750 mg total) by mouth two (2) times a day. 180 tablet 3  . losartan  (COZAAR ) 50 MG tablet Take 0.5 tablets (25 mg total) by mouth daily. (Patient not taking: Reported on 05/10/2023)     No current facility-administered medications for this visit.   ALLERGIES: No Known Allergies  Past Medical Hx: Past Medical History:  Diagnosis Date  . Diabetes mellitus (CMS-HCC)   . HLD (hyperlipidemia)   . Obesity, Class III, BMI 40-49.9 (morbid obesity) (CMS-HCC)    Past Surgical Hx: Past Surgical History:  Procedure  Laterality Date  . CHOLECYSTECTOMY  2002  . PR HYSTEROSCOPY,W/ENDO BX N/A 12/17/2012   Procedure: HYSTEROSCOPY, SURGICAL; WITH SAMPLING (BIOPSY) OF ENDOMETRIUM &/OR POLYPECTOMY, W/WO D&C;  Surgeon: Delon MALVA Macadam, MD;  Location: MAIN OR [facility];  Service: [facility] Primary Gynecology   Social History   Socioeconomic History  . Marital status: Single    Spouse name: None  . Number of children: None  . Years of education: None  . Highest education level: None  Tobacco Use  . Smoking status: Never  . Smokeless tobacco: Never  Vaping Use  . Vaping status: Never Used  Substance and Sexual Activity  . Alcohol use: No  . Drug use: No   Family Hx: Family History  Problem Relation Age of Onset  . Developmental delay Brother   . Anesthesia problems Neg Hx   . Clotting disorder Neg Hx   . Bleeding Disorder Neg Hx     Objective:    Physical Exam: Blood pressure 129/73, pulse 91, resp. rate 17, height 167.6 cm (5' 5.98), weight (!) 118.9 kg (262 lb 1.6 oz), last menstrual period 07/02/2002, not currently breastfeeding.  Note: Entirety of interview and examination performed via spanish interpreter  MSK:  Multiple healing plaques on arms/ legs  Neurological Examination:  MS: Alert and with appropriate affect, speech and language are normal, patient is able to provide details of their own medical history although poor historian. Cranial nerves: EOMI without nystagmus, visual fields are grossly, face is symmetric, tongue midline, hearing is intact  Motor: no tremor or involuntary movements  Coordination: No ataxia on finger to nose testing bilaterally  Gait:  in wheelchair   LABS: Levetiracetam  Level  Date Value Ref Range Status  10/04/2021 15.2 6.0 - 46.0 ug/mL Final    Comment:    This assay should not be used for patients taking Brivarecetam (BRIVIACT) due to cross-reactivity.      Lab Results  Component Value Date   LEVETIRACETA 15.2 10/04/2021

## 2024-02-12 ENCOUNTER — Other Ambulatory Visit: Payer: Self-pay

## 2024-02-12 ENCOUNTER — Emergency Department (HOSPITAL_COMMUNITY): Payer: Self-pay

## 2024-02-12 ENCOUNTER — Observation Stay (HOSPITAL_COMMUNITY)
Admission: EM | Admit: 2024-02-12 | Discharge: 2024-02-13 | Disposition: A | Discharge status: Home / Self Care | Payer: Self-pay | Attending: Emergency Medicine | Admitting: Emergency Medicine

## 2024-02-12 DIAGNOSIS — Z8673 Personal history of transient ischemic attack (TIA), and cerebral infarction without residual deficits: Secondary | ICD-10-CM | POA: Insufficient documentation

## 2024-02-12 DIAGNOSIS — Z7984 Long term (current) use of oral hypoglycemic drugs: Secondary | ICD-10-CM | POA: Insufficient documentation

## 2024-02-12 DIAGNOSIS — Z6841 Body Mass Index (BMI) 40.0 and over, adult: Secondary | ICD-10-CM | POA: Insufficient documentation

## 2024-02-12 DIAGNOSIS — E119 Type 2 diabetes mellitus without complications: Secondary | ICD-10-CM

## 2024-02-12 DIAGNOSIS — G40909 Epilepsy, unspecified, not intractable, without status epilepticus: Principal | ICD-10-CM | POA: Insufficient documentation

## 2024-02-12 DIAGNOSIS — R569 Unspecified convulsions: Principal | ICD-10-CM

## 2024-02-12 DIAGNOSIS — Z8679 Personal history of other diseases of the circulatory system: Secondary | ICD-10-CM

## 2024-02-12 DIAGNOSIS — Z794 Long term (current) use of insulin: Secondary | ICD-10-CM | POA: Insufficient documentation

## 2024-02-12 DIAGNOSIS — Z79899 Other long term (current) drug therapy: Secondary | ICD-10-CM | POA: Insufficient documentation

## 2024-02-12 DIAGNOSIS — I10 Essential (primary) hypertension: Secondary | ICD-10-CM | POA: Insufficient documentation

## 2024-02-12 DIAGNOSIS — E1165 Type 2 diabetes mellitus with hyperglycemia: Secondary | ICD-10-CM | POA: Insufficient documentation

## 2024-02-12 LAB — COMPREHENSIVE METABOLIC PANEL WITH GFR
ALT: 32 U/L (ref 0–44)
AST: 30 U/L (ref 15–41)
Albumin: 3.8 g/dL (ref 3.5–5.0)
Alkaline Phosphatase: 90 U/L (ref 38–126)
Anion gap: 11 (ref 5–15)
BUN: 11 mg/dL (ref 8–23)
CO2: 24 mmol/L (ref 22–32)
Calcium: 8.6 mg/dL — ABNORMAL LOW (ref 8.9–10.3)
Chloride: 101 mmol/L (ref 98–111)
Creatinine, Ser: 0.84 mg/dL (ref 0.44–1.00)
GFR, Estimated: >60 mL/min (ref >60)
Glucose, Bld: 323 mg/dL — ABNORMAL HIGH (ref 70–99)
Potassium: 3.9 mmol/L (ref 3.5–5.1)
Sodium: 136 mmol/L (ref 135–145)
Total Bilirubin: 0.8 mg/dL (ref 0.0–1.2)
Total Protein: 7 g/dL (ref 6.5–8.1)

## 2024-02-12 LAB — DIFFERENTIAL
Abs Immature Granulocytes: 0.05 K/uL (ref 0.00–0.07)
Basophils Absolute: 0.1 K/uL (ref 0.0–0.1)
Basophils Relative: 1 %
Eosinophils Absolute: 0 K/uL (ref 0.0–0.5)
Eosinophils Relative: 0 %
Immature Granulocytes: 0 %
Lymphocytes Relative: 22 %
Lymphs Abs: 2.6 K/uL (ref 0.7–4.0)
Monocytes Absolute: 0.6 K/uL (ref 0.1–1.0)
Monocytes Relative: 5 %
Neutro Abs: 8.4 K/uL — ABNORMAL HIGH (ref 1.7–7.7)
Neutrophils Relative %: 72 %

## 2024-02-12 LAB — APTT: aPTT: 24 s (ref 24–36)

## 2024-02-12 LAB — I-STAT CHEM 8, ED
BUN: 11 mg/dL (ref 8–23)
Calcium, Ion: 1.11 mmol/L — ABNORMAL LOW (ref 1.15–1.40)
Chloride: 103 mmol/L (ref 98–111)
Creatinine, Ser: 0.7 mg/dL (ref 0.44–1.00)
Glucose, Bld: 368 mg/dL — ABNORMAL HIGH (ref 70–99)
HCT: 46 % (ref 36.0–46.0)
Hemoglobin: 15.6 g/dL — ABNORMAL HIGH (ref 12.0–15.0)
Potassium: 3.8 mmol/L (ref 3.5–5.1)
Sodium: 139 mmol/L (ref 135–145)
TCO2: 25 mmol/L (ref 22–32)

## 2024-02-12 LAB — CBC
HCT: 43.6 % (ref 36.0–46.0)
Hemoglobin: 13.9 g/dL (ref 12.0–15.0)
MCH: 28.3 pg (ref 26.0–34.0)
MCHC: 31.9 g/dL (ref 30.0–36.0)
MCV: 88.8 fL (ref 80.0–100.0)
Platelets: 272 K/uL (ref 150–400)
RBC: 4.91 MIL/uL (ref 3.87–5.11)
RDW: 13.6 % (ref 11.5–15.5)
WBC: 11.8 K/uL — ABNORMAL HIGH (ref 4.0–10.5)
nRBC: 0 % (ref 0.0–0.2)

## 2024-02-12 LAB — PROTIME-INR
INR: 0.9 (ref 0.8–1.2)
Prothrombin Time: 12.5 s (ref 11.4–15.2)

## 2024-02-12 LAB — ETHANOL: Alcohol, Ethyl (B): <15 mg/dL (ref <15)

## 2024-02-12 MED ORDER — LEVETIRACETAM (KEPPRA) 500 MG/5 ML ADULT IV PUSH
2000.0000 mg | Freq: Once | INTRAVENOUS | Status: AC
  Administered 2024-02-12 (×2): 2000 mg via INTRAVENOUS

## 2024-02-12 NOTE — ED Notes (Signed)
 CCMD called.

## 2024-02-12 NOTE — ED Provider Notes (Signed)
 [place] EMERGENCY DEPARTMENT AT [facility] Provider Note   CSN: [id number] Arrival date & time: 02/12/24  2247  An emergency department physician performed an initial assessment on this suspected stroke patient at 2238.  Patient presents with: Code Stroke   Stephanie [name] is a 63 y.o. female.  Patient with past medical history significant for seizures, hypertension, craniotomy presents to the emergency department as a code stroke.  Patient from home where family last saw her acting normally at 9:10 PM.  They found her in a bathtub with left-sided weakness following a possible seizure.  Upon arrival neurology evaluated the patient and the patient had an NIHSS of 0.  They felt that her symptoms were likely due to seizures.  Seizure-like activity was witnessed as the patient was going to the CT scanner.  The patient did endorse a headache at around the time of the seizure-like activity.  Family is unsure if the patient is currently taking Keppra  or Depakote .  They do not know if she has missed a dose.   HPI     Prior to Admission medications   Medication Sig Start Date End Date Taking? Authorizing Provider  acetaminophen  (TYLENOL ) 325 MG tablet Take 650 mg by mouth every 6 (six) hours as needed for mild pain. 08/21/17   [provider]  albuterol  (VENTOLIN  HFA) 108 (90 Base) MCG/ACT inhaler Inhale 2 puffs into the lungs every 6 (six) hours as needed for wheezing or shortness of breath. 07/02/19   Drusilla Sabas RAMAN, MD  Blood Glucose Monitoring Suppl DEVI 1 each by Does not apply route 3 (three) times daily. May dispense any manufacturer covered by patient's insurance. 12/30/22   Danford, Lonni SQUIBB, MD  divalproex  (DEPAKOTE ) 500 MG DR tablet Take 1 tablet (500 mg total) by mouth every 12 (twelve) hours. 12/30/22   Danford, Lonni SQUIBB, MD  Glucose Blood (BLOOD GLUCOSE TEST STRIPS) STRP 1 each by Does not apply route 3 (three) times daily. Use as directed to check  blood sugar. May dispense any manufacturer covered by patient's insurance and fits patient's device. 12/30/22   Danford, Lonni SQUIBB, MD  insulin  NPH Human (NOVOLIN N) 100 UNIT/ML injection Inject 0.2 mLs (20 Units total) into the skin 2 (two) times daily before a meal. 12/30/22   Danford, Lonni SQUIBB, MD  Insulin  Syringe-Needle U-100 25G X 5/8 1 ML MISC 1 each by Does not apply route 3 (three) times daily. May dispense any manufacturer covered by patient's insurance. 12/30/22   Danford, Lonni SQUIBB, MD  Lancet Device MISC 1 each by Does not apply route 3 (three) times daily. May dispense any manufacturer covered by patient's insurance. 12/30/22   Danford, Lonni SQUIBB, MD  losartan  (COZAAR ) 50 MG tablet Take 50 mg by mouth daily.    [provider]  sitaGLIPtin-metformin (JANUMET) 50-1000 MG tablet Take 1 tablet by mouth 2 (two) times daily with a meal.    [provider]    Allergies: Patient has no known allergies.    Review of Systems  Updated Vital Signs BP (!) 115/54   Pulse (!) 101   Resp 19   Ht 5' 8 (1.727 m)   Wt 123.8 kg   SpO2 98%   BMI 41.51 kg/m   Physical Exam Vitals and nursing note reviewed.  Constitutional:      General: She is not in acute distress.    Appearance: She is well-developed.  HENT:     Head: Normocephalic and atraumatic.  Eyes:  Conjunctiva/sclera: Conjunctivae normal.  Cardiovascular:     Rate and Rhythm: Normal rate and regular rhythm.  Pulmonary:     Effort: Pulmonary effort is normal. No respiratory distress.     Breath sounds: Normal breath sounds.  Abdominal:     Palpations: Abdomen is soft.     Tenderness: There is no abdominal tenderness.  Musculoskeletal:        General: No swelling.     Cervical back: Neck supple.  Skin:    General: Skin is warm and dry.     Capillary Refill: Capillary refill takes less than 2 seconds.  Neurological:     General: No focal deficit present.     Mental Status: She is alert.   Psychiatric:        Mood and Affect: Mood normal.     (all labs ordered are listed, but only abnormal results are displayed) Labs Reviewed  CBC - Abnormal; Notable for the following components:      Result Value   WBC 11.8 (*)    All other components within normal limits  DIFFERENTIAL - Abnormal; Notable for the following components:   Neutro Abs 8.4 (*)    All other components within normal limits  COMPREHENSIVE METABOLIC PANEL WITH GFR - Abnormal; Notable for the following components:   Glucose, Bld 323 (*)    Calcium  8.6 (*)    All other components within normal limits  I-STAT CHEM 8, ED - Abnormal; Notable for the following components:   Glucose, Bld 368 (*)    Calcium , Ion 1.11 (*)    Hemoglobin 15.6 (*)    All other components within normal limits  ETHANOL  PROTIME-INR  APTT  RAPID URINE DRUG SCREEN, HOSP PERFORMED  VALPROIC ACID  LEVEL    EKG: None  Radiology: CT HEAD CODE STROKE WO CONTRAST Result Date: 02/12/2024 CLINICAL DATA:  Code stroke. Initial evaluation for acute neuro deficit, stroke suspected. EXAM: CT HEAD WITHOUT CONTRAST TECHNIQUE: Contiguous axial images were obtained from the base of the skull through the vertex without intravenous contrast. RADIATION DOSE REDUCTION: This exam was performed according to the departmental dose-optimization program which includes automated exposure control, adjustment of the mA and/or kV according to patient size and/or use of iterative reconstruction technique. COMPARISON:  Prior study from 12/29/2022 FINDINGS: Brain: Postoperative changes from prior right posterior craniotomy. 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 No acute intracranial hemorrhage. No acute large vessel territory infarct. No mass lesion or midline shift. No hydrocephalus or extra-axial fluid collection. Vascular: No abnormal hyperdense vessel. Skull: Scalp soft tissues within normal limits. Prior right posterior  craniotomy. Sinuses/Orbits: Left gaze preference noted. Paranasal sinuses are largely clear. No mastoid effusion. Other: None. ASPECTS [facility] Stroke Program Early CT Score) - Ganglionic level infarction (caudate, lentiform nuclei, internal capsule, insula, M1-M3 cortex): 7 - Supraganglionic infarction (M4-M6 cortex): 3 Total score (0-10 with 10 being normal): 10 IMPRESSION: 1. No acute intracranial abnormality. 2. ASPECTS is 10. 3. 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 These results were communicated to Dr. Vanessa at 10:56 pm on 02/12/2024 by text page via the [facility] messaging system. Electronically Signed   By: Morene Hoard M.D.   On: 02/12/2024 22:58     Procedures   Medications Ordered in the ED  levETIRAcetam  (KEPPRA ) undiluted injection 2,000 mg (2,000 mg Intravenous Given 02/12/24 2256)  Medical Decision Making Amount and/or Complexity of Data Reviewed Labs: ordered. Radiology: ordered.  Risk Decision regarding hospitalization.   This patient presents to the ED for concern of mental status changes, this involves an extensive number of treatment options, and is a complaint that carries with it a high risk of complications and morbidity.  The differential diagnosis includes seizure, CVA, other intracranial abnormality, electrolyte abnormality, infection, others   Co morbidities / Chronic conditions that complicate the patient evaluation  History of seizure, previous craniotomy   Additional history obtained:  Additional history obtained from EMR External records from outside source obtained and reviewed including neurology note   Lab Tests:  I Ordered, and personally interpreted labs.  The pertinent results include: White count of 11,800   Imaging Studies ordered:  I ordered imaging studies including CT head code stroke I independently  visualized and interpreted imaging which showed  1. No acute intracranial abnormality.  2. ASPECTS is 10.  3. Prior right posterior craniotomy with underlying chronic  encephalomalacia within the right parieto-occipital region, stable.   I agree with the radiologist interpretation   Cardiac Monitoring: / EKG:  The patient was maintained on a cardiac monitor.  I personally viewed and interpreted the cardiac monitored which showed an underlying rhythm of: Sinus rhythm   Problem List / ED Course / Critical interventions / Medication management   I ordered medication including Keppra  Reevaluation of the patient after these medicines showed that the patient remained stable    Consultations Obtained:  I requested consultation with the medicine service, Dr. Franky and discussed lab and imaging findings as well as pertinent plan - they recommend: Admission  I requested consultation with neurology.  Dr. Vanessa ruled out stroke, believes this is seizure related.  Recommends medicine admission.   Social Determinants of Health:  Patient has no reported health insurance   Test / Admission - Considered:  Patient with presentation concerning for seizure activity.  Neurology is recommending admission for observation for further seizure workup with likely adjustments to medication regimen.  Patient has had no further seizure activity since receiving the dose of Keppra .  She is stable for admission at this time.      Final diagnoses:  None    ED Discharge Orders     None          Logan Ubaldo KATHEE DEVONNA 02/13/24 0136    Griselda Norris, MD 02/14/24 [phone]

## 2024-02-12 NOTE — ED Triage Notes (Signed)
 Pt BIB EMS from home. Pt was at home and family reported LKN at 2110. Per EMS on their arrival pt was flaccid on the left side w/ a left sided gaze. Pt w/ history of seizures. Pt also reporting a severe HA and light flashing in her eyes.

## 2024-02-13 ENCOUNTER — Observation Stay (HOSPITAL_COMMUNITY): Payer: Self-pay

## 2024-02-13 ENCOUNTER — Other Ambulatory Visit (HOSPITAL_COMMUNITY): Payer: Self-pay

## 2024-02-13 ENCOUNTER — Encounter (HOSPITAL_COMMUNITY): Payer: Self-pay | Admitting: Internal Medicine

## 2024-02-13 DIAGNOSIS — G4089 Other seizures: Secondary | ICD-10-CM

## 2024-02-13 DIAGNOSIS — Z8679 Personal history of other diseases of the circulatory system: Secondary | ICD-10-CM

## 2024-02-13 DIAGNOSIS — E119 Type 2 diabetes mellitus without complications: Secondary | ICD-10-CM

## 2024-02-13 DIAGNOSIS — Z794 Long term (current) use of insulin: Secondary | ICD-10-CM

## 2024-02-13 DIAGNOSIS — R569 Unspecified convulsions: Secondary | ICD-10-CM

## 2024-02-13 LAB — CBC WITH DIFFERENTIAL/PLATELET
Abs Immature Granulocytes: 0.06 K/uL (ref 0.00–0.07)
Basophils Absolute: 0.1 K/uL (ref 0.0–0.1)
Basophils Relative: 1 %
Eosinophils Absolute: 0 K/uL (ref 0.0–0.5)
Eosinophils Relative: 0 %
HCT: 38.5 % (ref 36.0–46.0)
Hemoglobin: 12.4 g/dL (ref 12.0–15.0)
Immature Granulocytes: 1 %
Lymphocytes Relative: 24 %
Lymphs Abs: 2.7 K/uL (ref 0.7–4.0)
MCH: 27.5 pg (ref 26.0–34.0)
MCHC: 32.2 g/dL (ref 30.0–36.0)
MCV: 85.4 fL (ref 80.0–100.0)
Monocytes Absolute: 0.5 K/uL (ref 0.1–1.0)
Monocytes Relative: 5 %
Neutro Abs: 7.7 K/uL (ref 1.7–7.7)
Neutrophils Relative %: 69 %
Platelets: 248 K/uL (ref 150–400)
RBC: 4.51 MIL/uL (ref 3.87–5.11)
RDW: 13.6 % (ref 11.5–15.5)
WBC: 11 K/uL — ABNORMAL HIGH (ref 4.0–10.5)
nRBC: 0 % (ref 0.0–0.2)

## 2024-02-13 LAB — HIV ANTIBODY (ROUTINE TESTING W REFLEX): HIV Screen 4th Generation wRfx: NONREACTIVE

## 2024-02-13 LAB — BASIC METABOLIC PANEL WITH GFR
Anion gap: 5 (ref 5–15)
BUN: 10 mg/dL (ref 8–23)
CO2: 24 mmol/L (ref 22–32)
Calcium: 8.6 mg/dL — ABNORMAL LOW (ref 8.9–10.3)
Chloride: 107 mmol/L (ref 98–111)
Creatinine, Ser: 0.66 mg/dL (ref 0.44–1.00)
GFR, Estimated: >60 mL/min (ref >60)
Glucose, Bld: 260 mg/dL — ABNORMAL HIGH (ref 70–99)
Potassium: 4 mmol/L (ref 3.5–5.1)
Sodium: 136 mmol/L (ref 135–145)

## 2024-02-13 LAB — HEPATIC FUNCTION PANEL
ALT: 28 U/L (ref 0–44)
AST: 24 U/L (ref 15–41)
Albumin: 3.1 g/dL — ABNORMAL LOW (ref 3.5–5.0)
Alkaline Phosphatase: 73 U/L (ref 38–126)
Bilirubin, Direct: <0.1 mg/dL (ref 0.0–0.2)
Total Bilirubin: 0.5 mg/dL (ref 0.0–1.2)
Total Protein: 6.2 g/dL — ABNORMAL LOW (ref 6.5–8.1)

## 2024-02-13 LAB — RAPID URINE DRUG SCREEN, HOSP PERFORMED
Amphetamines: NOT DETECTED
Barbiturates: NOT DETECTED
Benzodiazepines: NOT DETECTED
Cocaine: NOT DETECTED
Opiates: NOT DETECTED
Tetrahydrocannabinol: NOT DETECTED

## 2024-02-13 LAB — URINALYSIS, ROUTINE W REFLEX MICROSCOPIC
Bacteria, UA: NONE SEEN
Bilirubin Urine: NEGATIVE
Glucose, UA: >=500 mg/dL — AB
Hgb urine dipstick: NEGATIVE
Ketones, ur: 20 mg/dL — AB
Leukocytes,Ua: NEGATIVE
Nitrite: NEGATIVE
Protein, ur: NEGATIVE mg/dL
Specific Gravity, Urine: 1.033 — ABNORMAL HIGH (ref 1.005–1.030)
pH: 5 (ref 5.0–8.0)

## 2024-02-13 LAB — GLUCOSE, CAPILLARY: Glucose-Capillary: 319 mg/dL — ABNORMAL HIGH (ref 70–99)

## 2024-02-13 LAB — VALPROIC ACID LEVEL: Valproic Acid Lvl: <10 ug/mL — ABNORMAL LOW (ref 50–100)

## 2024-02-13 LAB — CBG MONITORING, ED: Glucose-Capillary: 238 mg/dL — ABNORMAL HIGH (ref 70–99)

## 2024-02-13 LAB — HEMOGLOBIN A1C
Hgb A1c MFr Bld: 10.4 % — ABNORMAL HIGH (ref 4.8–5.6)
Mean Plasma Glucose: 251.78 mg/dL

## 2024-02-13 MED ORDER — LEVETIRACETAM 1000 MG PO TABS: 1000.0000 mg | ORAL_TABLET | Freq: Two times a day (BID) | ORAL | 0 refills | Status: AC

## 2024-02-13 MED ORDER — LEVETIRACETAM 1000 MG PO TABS
1000.0000 mg | ORAL_TABLET | Freq: Two times a day (BID) | ORAL | 0 refills | Status: DC
  Filled 2024-02-13: qty 180, 90d supply, fill #0

## 2024-02-13 MED ORDER — ENOXAPARIN SODIUM 60 MG/0.6ML IJ SOSY
60.0000 mg | PREFILLED_SYRINGE | INTRAMUSCULAR | Status: DC
Start: 2024-02-13 — End: 2024-02-13
  Administered 2024-02-13: 60 mg via SUBCUTANEOUS
  Filled 2024-02-13: qty 0.6

## 2024-02-13 MED ORDER — LEVETIRACETAM 500 MG PO TABS
1000.0000 mg | ORAL_TABLET | Freq: Two times a day (BID) | ORAL | Status: DC
  Administered 2024-02-13: 1000 mg via ORAL
  Filled 2024-02-13: qty 2

## 2024-02-13 MED ORDER — LEVETIRACETAM (KEPPRA) 500 MG/5 ML ADULT IV PUSH: 1000.0000 mg | Freq: Two times a day (BID) | INTRAVENOUS | Status: DC

## 2024-02-13 MED ORDER — INSULIN ASPART 100 UNIT/ML IJ SOLN
0.0000 [IU] | Freq: Three times a day (TID) | INTRAMUSCULAR | Status: DC
  Administered 2024-02-13: 3 [IU] via SUBCUTANEOUS

## 2024-02-13 MED ORDER — INSULIN NPH (HUMAN) (ISOPHANE) 100 UNIT/ML ~~LOC~~ SUSP
20.0000 [IU] | Freq: Two times a day (BID) | SUBCUTANEOUS | Status: DC
  Administered 2024-02-13: 20 [IU] via SUBCUTANEOUS
  Filled 2024-02-13: qty 10

## 2024-02-13 NOTE — Progress Notes (Signed)
 EEG complete - results pending

## 2024-02-13 NOTE — Discharge Summary (Signed)
 Physician Discharge Summary  Giselle Brutus FMW:[id number] DOB: [birth year] DOA: 02/12/2024  PCP: Default, Provider, MD  Admit date: 02/12/2024 Discharge date: 02/13/24  Admitted From: Home Disposition: Home Recommendations for Outpatient Follow-up:  Outpatient follow-up with PCP and neurology in 1 to 2 weeks Check CMP and CBC at follow-up Please follow up on the following pending results: None  Home Health: None Equipment/Devices: None  Discharge Condition: Stable CODE STATUS: Full code Diet Orders (From admission, onward)     Start     Ordered   02/13/24 0000  Diet - low sodium heart healthy        02/13/24 0819   02/13/24 0000  Diet Carb Modified        02/13/24 0819              Hospital course 63 year old F with PMH of brain abscess s/p craniectomy in 2019, SDH, seizure disorder, DM-2 and morbid obesity brought to ED after tonic-clonic seizure at home.  Patient became confused likely from postictal state after seizure that prompted family to bring patient to the hospital.  Patient reports good compliance with Keppra .  No new symptoms other than some frontal headache prior to the seizure.  In ED, elevated BP to 172/92.  HR 111.  CT head without acute finding.  Labs without significant finding other than mild hyperglycemia and mild leukocytosis.  She had another tonic-clonic seizure while in ED. neurology consulted.  She was loaded with Keppra .  Neurology recommended increasing Keppra  to 1000 mg twice daily.  She was on 750 mg twice daily before admission.  EEG negative for seizure or epileptiform discharge.  She was cleared for discharge by neurology on increased dose of Keppra  at 1000 mg twice daily.  Encouraged to follow-up with PCP and neurology.  Counseled on the importance of seizure precautions including not driving until seizure-free for 6 months  See individual problem list below for more.   Problems addressed during this hospitalization Breakthrough  seizure - Keppra  increased - Outpatient follow-up with neurology  History of brain abscess s/p craniectomy in 2019  History of subdural hematoma  Essential hypertension: BP improved  IDDM-2 with hyperglycemia: A1c 10.4%. -Continue home insulin  and Janumet -Outpatient follow-up with PCP  Morbid obesity Body mass index is 41.51 kg/m.  -Encourage lifestyle change to lose weight         Consultations: Neurology  Time spent 35  minutes  Vital signs Vitals:   02/13/24 0545 02/13/24 0615 02/13/24 0700 02/13/24 0914  BP: 124/67 125/88 127/82 (!) 118/59  Pulse: 77 76 78 86  Temp:    98.4 F (36.9 C)  Resp: 16 17 15 16   Height:      Weight:      SpO2: 91% 92% 99% 94%  TempSrc:    Oral  BMI (Calculated):         Discharge exam  GENERAL: No apparent distress.  Nontoxic. HEENT: MMM.  Vision and hearing grossly intact.  NECK: Supple.  No apparent JVD.  RESP:  No IWOB.  Fair aeration bilaterally. CVS:  RRR. Heart sounds normal.  ABD/GI/GU: BS+. Abd soft, NTND.  MSK/EXT:  Moves extremities. No apparent deformity. No edema.  SKIN: no apparent skin lesion or wound NEURO: Awake and alert. Oriented appropriately.  No apparent focal neuro deficit. PSYCH: Calm. Normal affect.   Discharge Instructions Discharge Instructions     Diet - low sodium heart healthy   Complete by: As directed    Diet Carb Modified  Complete by: As directed    Discharge instructions   Complete by: As directed    It has been a pleasure taking care of you!  You were hospitalized due to breakthrough seizure.  Your Keppra  has been increased to reduce your risk of breakthrough seizure.  Please review your new medication list and the directions on your medications before you take them.  Follow-up with your neurologist as soon as possible.  Follow-up with your primary care doctor in 1 to 2 weeks or sooner if needed.  [place], patients with seizures are not allowed to drive  until  they have been seizure-free for six months. Use caution when using heavy equipment or power tools. Avoid working on ladders or at heights. Take showers instead of baths. Ensure the water temperature is not too high on the home water heater. Do not go swimming alone. When caring for infants or small children, sit down when holding, feeding, or changing them to minimize risk of injury to the child in the event you have a seizure.  To reduce risk of seizures, maintain good sleep hygiene avoid alcohol and illicit drug use, take all anti-seizure medications as prescribed.     Take care,   Increase activity slowly   Complete by: As directed       Allergies as of 02/13/2024   No Known Allergies      Medication List     TAKE these medications    acetaminophen  325 MG tablet Commonly known as: TYLENOL  Take 650 mg by mouth every 6 (six) hours as needed for mild pain.   Blood Glucose Monitoring Suppl Devi 1 each by Does not apply route 3 (three) times daily. May dispense any manufacturer covered by patient's insurance.   BLOOD GLUCOSE TEST STRIPS Strp 1 each by Does not apply route 3 (three) times daily. Use as directed to check blood sugar. May dispense any manufacturer covered by patient's insurance and fits patient's device.   insulin  NPH Human 100 UNIT/ML injection Commonly known as: NOVOLIN N Inject 0.2 mLs (20 Units total) into the skin 2 (two) times daily before a meal.   Insulin  Syringe-Needle U-100 25G X 5/8 1 ML Misc 1 each by Does not apply route 3 (three) times daily. May dispense any manufacturer covered by patient's insurance.   Lancet Device Misc 1 each by Does not apply route 3 (three) times daily. May dispense any manufacturer covered by patient's insurance.   levETIRAcetam  1000 MG tablet Commonly known as: KEPPRA  Take 1 tablet (1,000 mg total) by mouth 2 (two) times daily. What changed:  medication strength how much to take   losartan  50 MG tablet Commonly  known as: COZAAR  Take 50 mg by mouth daily.   rosuvastatin 20 MG tablet Commonly known as: CRESTOR Take 20 mg by mouth daily.   sitaGLIPtin-metformin 50-1000 MG tablet Commonly known as: JANUMET Take 1 tablet by mouth 2 (two) times daily with a meal.         Procedures/Studies:    EEG adult Result Date: 02/13/2024 Shelton Arlin KIDD, MD     02/13/2024  9:49 AM Patient Name: Stephanie [name] MRN: [id number] Epilepsy Attending: Arlin KIDD Shelton Referring Physician/Provider: Khaliqdina, Salman, MD Date: 02/13/2024 Duration: 22.33 mins Patient history:  63 y.o. female with hx of diabetes, prior history of craniotomy due to a brain abscess in the right parieto-occipital region complicated by subdural hematoma and also by seizures and on Keppra  who was brought into the ED  after having a episode of focal seizures with left gaze deviation and stiffening of the left arm. EEG to evaluate for seizure Level of alertness: Awake, asleep AEDs during EEG study: LEV Technical aspects: This EEG study was done with scalp electrodes positioned according to the 10-20 International system of electrode placement. Electrical activity was reviewed with band pass filter of 1-70Hz , sensitivity of 7 uV/mm, display speed of 89mm/sec with a 60Hz  notched filter applied as appropriate. EEG data were recorded continuously and digitally stored.  Video monitoring was available and reviewed as appropriate. Description: The posterior dominant rhythm consists of 9 Hz activity of moderate voltage (25-35 uV) seen predominantly in posterior head regions, symmetric and reactive to eye opening and eye closing. Sleep was characterized by vertex waves, maximal frontocentral region. EEG showed continuous 3 to 6 Hz theta-delta slowing in right hemisphere. Hyperventilation and photic stimulation were not performed.   ABNORMALITY - Continuous slow, right hemisphere IMPRESSION: This study is suggestive of cortical dysfunction arising from  right hemisphere likely secondary to underlying structural abnormality. No seizures or epileptiform discharges were seen throughout the recording. Arlin MALVA Krebs   CT HEAD CODE STROKE WO CONTRAST Result Date: 02/12/2024 CLINICAL DATA:  Code stroke. Initial evaluation for acute neuro deficit, stroke suspected. EXAM: CT HEAD WITHOUT CONTRAST TECHNIQUE: Contiguous axial images were obtained from the base of the skull through the vertex without intravenous contrast. RADIATION DOSE REDUCTION: This exam was performed according to the departmental dose-optimization program which includes automated exposure control, adjustment of the mA and/or kV according to patient size and/or use of iterative reconstruction technique. COMPARISON:  Prior study from 12/29/2022 FINDINGS: Brain: Postoperative changes from prior right posterior craniotomy. 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 No acute intracranial hemorrhage. No acute large vessel territory infarct. No mass lesion or midline shift. No hydrocephalus or extra-axial fluid collection. Vascular: No abnormal hyperdense vessel. Skull: Scalp soft tissues within normal limits. Prior right posterior craniotomy. Sinuses/Orbits: Left gaze preference noted. Paranasal sinuses are largely clear. No mastoid effusion. Other: None. ASPECTS [facility] Stroke Program Early CT Score) - Ganglionic level infarction (caudate, lentiform nuclei, internal capsule, insula, M1-M3 cortex): 7 - Supraganglionic infarction (M4-M6 cortex): 3 Total score (0-10 with 10 being normal): 10 IMPRESSION: 1. No acute intracranial abnormality. 2. ASPECTS is 10. 3. 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 These results were communicated to Dr. Vanessa at 10:56 pm on 02/12/2024 by text page via the [facility] messaging system. Electronically Signed   By: Morene Hoard M.D.   On: 02/12/2024  22:58       The results of significant diagnostics from this hospitalization (including imaging, microbiology, ancillary and laboratory) are listed below for reference.     Microbiology: No results found for this or any previous visit (from the past 240 hours).   Labs:  CBC: Recent Labs  Lab 02/12/24 2244 02/12/24 2249 02/13/24 0355  WBC 11.8*  --  11.0*  NEUTROABS 8.4*  --  7.7  HGB 13.9 15.6* 12.4  HCT 43.6 46.0 38.5  MCV 88.8  --  85.4  PLT 272  --  248   BMP &GFR Recent Labs  Lab 02/12/24 2244 02/12/24 2249 02/13/24 0355  NA 136 139 136  K 3.9 3.8 4.0  CL 101 103 107  CO2 24  --  24  GLUCOSE 323* 368* 260*  BUN 11 11 10   CREATININE 0.84 0.70 0.66  CALCIUM  8.6*  --  8.6*  Estimated Creatinine Clearance: 98.6 mL/min (by C-G formula based on SCr of 0.66 mg/dL). Liver & Pancreas: Recent Labs  Lab 02/12/24 2244 02/13/24 0355  AST 30 24  ALT 32 28  ALKPHOS 90 73  BILITOT 0.8 0.5  PROT 7.0 6.2*  ALBUMIN 3.8 3.1*   No results for input(s): LIPASE, AMYLASE in the last 168 hours. No results for input(s): AMMONIA in the last 168 hours. Diabetic: Recent Labs    02/13/24 0355  HGBA1C 10.4*   Recent Labs  Lab 02/12/24 2243 02/13/24 0746  GLUCAP 319* 238*   Cardiac Enzymes: No results for input(s): CKTOTAL, CKMB, CKMBINDEX, TROPONINI in the last 168 hours. No results for input(s): PROBNP in the last 8760 hours. Coagulation Profile: Recent Labs  Lab 02/12/24 2244  INR 0.9   Thyroid Function Tests: No results for input(s): TSH, T4TOTAL, FREET4, T3FREE, THYROIDAB in the last 72 hours. Lipid Profile: No results for input(s): CHOL, HDL, LDLCALC, TRIG, CHOLHDL, LDLDIRECT in the last 72 hours. Anemia Panel: No results for input(s): VITAMINB12, FOLATE, FERRITIN, TIBC, IRON, RETICCTPCT in the last 72 hours. Urine analysis:    Component Value Date/Time   COLORURINE YELLOW 02/13/2024 0031    APPEARANCEUR CLEAR 02/13/2024 0031   LABSPEC 1.033 (H) 02/13/2024 0031   PHURINE 5.0 02/13/2024 0031   GLUCOSEU >=500 (A) 02/13/2024 0031   HGBUR NEGATIVE 02/13/2024 0031   BILIRUBINUR NEGATIVE 02/13/2024 0031   KETONESUR 20 (A) 02/13/2024 0031   PROTEINUR NEGATIVE 02/13/2024 0031   NITRITE NEGATIVE 02/13/2024 0031   LEUKOCYTESUR NEGATIVE 02/13/2024 0031   Sepsis Labs: Invalid input(s): PROCALCITONIN, LACTICIDVEN   SIGNED:  Janiyah Beery T Emmanuel Ercole, MD  [facility] 02/13/2024, 4:08 PM

## 2024-02-13 NOTE — H&P (Signed)
 History and Physical    Stephanie [name] FMW:[id number] DOB: July 15, 1960 DOA: 02/12/2024  Patient coming from: Home.  Chief Complaint: Seizures.  Engineer, structural used.  HPI: Stephanie [name] is a 63 y.o. female with history of diabetes mellitus type 2 with prior history of craniotomy for brain abscess in 2019 with history of subdural hematoma and seizures presently on Keppra  was brought to the ER after patient's husband noticed that patient was having tonic-clonic seizure at home.  Following which patient became confused and patient was brought to the ER.  Patient states she has been not feeling well yesterday with some frontal headache.  Denies any fever chills nausea or vomiting.  Has been compliant with the Keppra .  Patient states about 3 days ago when she was sleeping she did wake up with a tongue bite.  She also had a recent hypoglycemic episode but she does not recall the exact number.  ED Course: In the ER while being taken to CT scan she had another tonic-clonic seizure.  Neurology was consulted patient was given Keppra  loading dose and admitted for breakthrough seizures.  CT head is unremarkable shows chronic changes.  Labs show WBC of 11.8.  Glucose of 323.  At the time of my exam patient is alert awake oriented and appears nonfocal.  Review of Systems: As per HPI, rest all negative.   Past Medical History:  Diagnosis Date   Cerebral abscess 2019   Diabetes mellitus, type 2 (HCC)    HTN (hypertension)     Past Surgical History:  Procedure Laterality Date   CRANIOTOMY  2019     reports that she has never smoked. She has never used smokeless tobacco. She reports that she does not drink alcohol. No history on file for drug use.  No Known Allergies  History reviewed. No pertinent family history.  Prior to Admission medications   Medication Sig Start Date End Date Taking? Authorizing Provider  divalproex  (DEPAKOTE ) 500 MG DR tablet Take 1 tablet (500 mg  total) by mouth every 12 (twelve) hours. 12/30/22  Yes Danford, Lonni SQUIBB, MD  insulin  NPH Human (NOVOLIN N) 100 UNIT/ML injection Inject 0.2 mLs (20 Units total) into the skin 2 (two) times daily before a meal. 12/30/22  Yes Danford, Lonni SQUIBB, MD  losartan  (COZAAR ) 50 MG tablet Take 50 mg by mouth daily.   Yes [provider]  rosuvastatin (CRESTOR) 20 MG tablet Take 20 mg by mouth daily. 01/08/24  Yes [provider]  sitaGLIPtin-metformin (JANUMET) 50-1000 MG tablet Take 1 tablet by mouth 2 (two) times daily with a meal.   Yes [provider]  acetaminophen  (TYLENOL ) 325 MG tablet Take 650 mg by mouth every 6 (six) hours as needed for mild pain. 08/21/17   [provider]  Blood Glucose Monitoring Suppl DEVI 1 each by Does not apply route 3 (three) times daily. May dispense any manufacturer covered by patient's insurance. 12/30/22   Danford, Lonni SQUIBB, MD  Glucose Blood (BLOOD GLUCOSE TEST STRIPS) STRP 1 each by Does not apply route 3 (three) times daily. Use as directed to check blood sugar. May dispense any manufacturer covered by patient's insurance and fits patient's device. 12/30/22   Danford, Lonni SQUIBB, MD  Insulin  Syringe-Needle U-100 25G X 5/8 1 ML MISC 1 each by Does not apply route 3 (three) times daily. May dispense any manufacturer covered by patient's insurance. 12/30/22   Danford, Lonni SQUIBB, MD  Lancet Device MISC 1 each by Does not apply  route 3 (three) times daily. May dispense any manufacturer covered by patient's insurance. 12/30/22   Danford, Lonni SQUIBB, MD  levETIRAcetam  (KEPPRA ) 750 MG tablet Take 750 mg by mouth 2 (two) times daily. Patient not taking: Reported on 02/13/2024    [provider]    Physical Exam: Constitutional: Moderately built and nourished. Vitals:   02/12/24 2200 02/12/24 2301 02/12/24 2307 02/12/24 2330  BP:  (!) 172/92  (!) 115/54  Pulse:  (!) 111  (!) 101  Resp:  18  19  SpO2:  91%  98%   Weight: 113.6 kg  123.8 kg   Height:   5' 8 (1.727 m)    Eyes: Anicteric no pallor. ENMT: No discharge from the ears eyes nose or mouth. Neck: No mass felt.  No neck rigidity. Respiratory: No rhonchi or crepitations. Cardiovascular: S1-S2 heard. Abdomen: Soft nontender bowel sound present. Musculoskeletal: No edema. Skin: No rash. Neurologic: Alert awake oriented time place and person.  Moves all extremities. Psychiatric: Appears normal.  Normal affect.   Labs on Admission: I have personally reviewed following labs and imaging studies  CBC: Recent Labs  Lab 02/12/24 2244 02/12/24 2249  WBC 11.8*  --   NEUTROABS 8.4*  --   HGB 13.9 15.6*  HCT 43.6 46.0  MCV 88.8  --   PLT 272  --    Basic Metabolic Panel: Recent Labs  Lab 02/12/24 2244 02/12/24 2249  NA 136 139  K 3.9 3.8  CL 101 103  CO2 24  --   GLUCOSE 323* 368*  BUN 11 11  CREATININE 0.84 0.70  CALCIUM  8.6*  --    GFR: Estimated Creatinine Clearance: 98.6 mL/min (by C-G formula based on SCr of 0.7 mg/dL). Liver Function Tests: Recent Labs  Lab 02/12/24 2244  AST 30  ALT 32  ALKPHOS 90  BILITOT 0.8  PROT 7.0  ALBUMIN 3.8   No results for input(s): LIPASE, AMYLASE in the last 168 hours. No results for input(s): AMMONIA in the last 168 hours. Coagulation Profile: Recent Labs  Lab 02/12/24 2244  INR 0.9   Cardiac Enzymes: No results for input(s): CKTOTAL, CKMB, CKMBINDEX, TROPONINI in the last 168 hours. BNP (last 3 results) No results for input(s): PROBNP in the last 8760 hours. HbA1C: No results for input(s): HGBA1C in the last 72 hours. CBG: No results for input(s): GLUCAP in the last 168 hours. Lipid Profile: No results for input(s): CHOL, HDL, LDLCALC, TRIG, CHOLHDL, LDLDIRECT in the last 72 hours. Thyroid Function Tests: No results for input(s): TSH, T4TOTAL, FREET4, T3FREE, THYROIDAB in the last 72 hours. Anemia Panel: No results for  input(s): VITAMINB12, FOLATE, FERRITIN, TIBC, IRON, RETICCTPCT in the last 72 hours. Urine analysis:    Component Value Date/Time   COLORURINE YELLOW 02/13/2024 0031   APPEARANCEUR CLEAR 02/13/2024 0031   LABSPEC 1.033 (H) 02/13/2024 0031   PHURINE 5.0 02/13/2024 0031   GLUCOSEU >=500 (A) 02/13/2024 0031   HGBUR NEGATIVE 02/13/2024 0031   BILIRUBINUR NEGATIVE 02/13/2024 0031   KETONESUR 20 (A) 02/13/2024 0031   PROTEINUR NEGATIVE 02/13/2024 0031   NITRITE NEGATIVE 02/13/2024 0031   LEUKOCYTESUR NEGATIVE 02/13/2024 0031   Sepsis Labs: @LABRCNTIP (procalcitonin:4,lacticidven:4) )No results found for this or any previous visit (from the past 240 hours).   Radiological Exams on Admission: CT HEAD CODE STROKE WO CONTRAST Result Date: 02/12/2024 CLINICAL DATA:  Code stroke. Initial evaluation for acute neuro deficit, stroke suspected. EXAM: CT HEAD WITHOUT CONTRAST TECHNIQUE: Contiguous axial images were obtained from  the base of the skull through the vertex without intravenous contrast. RADIATION DOSE REDUCTION: This exam was performed according to the departmental dose-optimization program which includes automated exposure control, adjustment of the mA and/or kV according to patient size and/or use of iterative reconstruction technique. COMPARISON:  Prior study from 12/29/2022 FINDINGS: Brain: Postoperative changes from prior right posterior craniotomy. 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 No acute intracranial hemorrhage. No acute large vessel territory infarct. No mass lesion or midline shift. No hydrocephalus or extra-axial fluid collection. Vascular: No abnormal hyperdense vessel. Skull: Scalp soft tissues within normal limits. Prior right posterior craniotomy. Sinuses/Orbits: Left gaze preference noted. Paranasal sinuses are largely clear. No mastoid effusion. Other: None. ASPECTS [facility] Stroke Program Early CT Score) - Ganglionic level  infarction (caudate, lentiform nuclei, internal capsule, insula, M1-M3 cortex): 7 - Supraganglionic infarction (M4-M6 cortex): 3 Total score (0-10 with 10 being normal): 10 IMPRESSION: 1. No acute intracranial abnormality. 2. ASPECTS is 10. 3. 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 These results were communicated to Dr. Vanessa at 10:56 pm on 02/12/2024 by text page via the [facility] messaging system. Electronically Signed   By: Morene Hoard M.D.   On: 02/12/2024 22:58    EKG: Independently reviewed.  Sinus tachycardia.  Assessment/Plan Principal Problem:   Seizure (HCC) Active Problems:   Type 2 diabetes mellitus without complication, with long-term current use of insulin  (HCC)   History of subdural hematoma    Breakthrough seizures -      appreciate neurology consult.  Patient was given Keppra  loading dose and patient's Keppra  dose is going to be adjusted by neurology awaiting further recommendation.  Patient is having EEG done.  Seizure precautions. Diabetes mellitus type 2 last hemoglobin A1c about 11.6 a year ago.  Will recheck hemoglobin A1c.  Patient states she takes Novolin and 40 units twice daily.  Medication reconciliation shows 20 units twice daily which have continued now along with sliding scale coverage.  Positive follow CBGs since patient states she had some hypoglycemic episodes.  Patient also takes Janumet. Prior history of brain abscess status post craniotomy.  Patient prior history used to be on medications for hyperlipidemia hypertension which patient states she no longer takes.  Since patient has breakthrough seizures will need close monitoring further workup and more than 2 midnight stay.   DVT prophylaxis: Lovenox . Code Status: Full code. Family Communication: Patient's husband. Disposition Plan: Monitored bed. Consults called: Neurology. Admission status:  Observation.

## 2024-02-13 NOTE — Consult Note (Signed)
 NEUROLOGY CONSULT NOTE   Date of service: February 13, 2024 Patient Name: Stephanie [name] MRN:  [id number] DOB:  Feb 14, 1961 Chief Complaint: seizure Requesting Provider: Franky Redia SAILOR, MD  History of Present Illness  Stephanie [name] is a 62 y.o. 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  Patient speaks Spanish and initially history was obtained with a Spanish video interpreter.  However, daughter and husband at the bedside later and able to provide more history in [place].  Patient has been experiencing flashes in her vision on the left side for the last year and more prominent over the last day.  Today in the evening, patient was in a bathtub.  She was in the bathroom for more than 20 minutes and so husband went in and checked in on her and noted that she was weak on the left side and her eyes were looking to the left.  He called EMS.  Patient was brought in as a code stroke for concern for left gaze deviation and left-sided weakness. Weakness resolved by the time of arrival to the ED.  Patient has been compliant with her Keppra .  LKW: 2110 Modified rankin score: 1-No significant post stroke disability and can perform usual duties with stroke symptoms IV Thrombolysis: Not offered, patient is back to her baseline. EVT: Not offered, low suspicion for stroke.    NIHSS components Score: Comment  1a Level of Conscious 0[x]  1[]  2[]  3[]      1b LOC Questions 0[x]  1[]  2[]       1c LOC Commands 0[x]  1[]  2[]       2 Best Gaze 0[x]  1[]  2[]       3 Visual 0[x]  1[]  2[]  3[]      4 Facial Palsy 0[x]  1[]  2[]  3[]      5a Motor Arm - left 0[x]  1[]  2[]  3[]  4[]  UN[]    5b Motor Arm - Right 0[x]  1[]  2[]  3[]  4[]  UN[]    6a  Motor Leg - Left 0[x]  1[]  2[]  3[]  4[]  UN[]    6b Motor Leg - Right 0[x]  1[]  2[]  3[]  4[]  UN[]    7 Limb Ataxia 0[x]  1[]  2[]  UN[]      8 Sensory 0[x]  1[]  2[]  UN[]      9 Best Language 0[x]  1[]  2[]  3[]      10 Dysarthria 0[x]  1[]  2[]  UN[]      11 Extinct. and Inattention 0[x]  1[]  2[]       TOTAL: 0      ROS  Comprehensive ROS performed and pertinent positives documented in HPI   Past History   Past Medical History:  Diagnosis Date   Cerebral abscess 2019   Diabetes mellitus, type 2 (HCC)    HTN (hypertension)     Past Surgical History:  Procedure Laterality Date   CRANIOTOMY  2019    Family History: No family history on file.  Social History  reports that she has never smoked. She has never used smokeless tobacco. She reports that she does not drink alcohol. No history on file for drug use.  No Known Allergies  Medications   Current Facility-Administered Medications:    levETIRAcetam  (KEPPRA ) undiluted injection 1,000 mg, 1,000 mg, Intravenous, BID **OR** levETIRAcetam  (KEPPRA ) tablet 1,000 mg, 1,000 mg, Oral, BID, Stephanie Groll, MD  Current Outpatient Medications:    divalproex  (DEPAKOTE ) 500 MG DR tablet, Take 1 tablet (500 mg total) by mouth every 12 (twelve) hours., Disp: 60 tablet, Rfl: 3   insulin  NPH Human (NOVOLIN N) 100 UNIT/ML injection, Inject 0.2 mLs (20 Units total) into the skin 2 (two) times daily before a meal., Disp: 10 mL, Rfl: 11   losartan  (COZAAR ) 50 MG tablet, Take 50 mg by mouth daily., Disp: , Rfl:    rosuvastatin (CRESTOR) 20 MG tablet, Take 20 mg by mouth daily., Disp: , Rfl:    sitaGLIPtin-metformin (JANUMET) 50-1000 MG tablet, Take 1 tablet by mouth 2 (two) times daily with a meal., Disp: , Rfl:    acetaminophen  (TYLENOL ) 325 MG tablet, Take 650 mg by mouth every 6 (six) hours as needed for mild pain., Disp: , Rfl:    Blood Glucose Monitoring Suppl DEVI, 1 each by Does not apply route 3 (three) times daily. May dispense any manufacturer covered by  patient's insurance., Disp: 1 each, Rfl: 0   Glucose Blood (BLOOD GLUCOSE TEST STRIPS) STRP, 1 each by Does not apply route 3 (three) times daily. Use as directed to check blood sugar. May dispense any manufacturer covered by patient's insurance and fits patient's device., Disp: 100 strip, Rfl: 0   Insulin  Syringe-Needle U-100 25G X 5/8 1 ML MISC, 1 each by Does not apply route 3 (three) times daily. May dispense any manufacturer covered by patient's insurance., Disp: 100 each, Rfl: 0   Lancet Device MISC, 1 each by Does not apply route 3 (three) times daily. May dispense any manufacturer covered by patient's insurance., Disp: 1 each, Rfl: 0   levETIRAcetam  (KEPPRA ) 750 MG tablet, Take 750 mg by mouth 2 (two) times daily. (Patient not taking: Reported on 02/13/2024), Disp: , Rfl:   Vitals   Vitals:   02/12/24 2200 02/12/24 2301 02/12/24 2307 02/12/24 2330  BP:  (!) 172/92  (!) 115/54  Pulse:  (!) 111  (!) 101  Resp:  18  19  SpO2:  91%  98%  Weight: 113.6 kg  123.8 kg   Height:   5' 8 (1.727 m)     Body mass index is 41.51 kg/m.   Physical Exam   General: Laying comfortably in bed; in no acute distress.  HENT: Normal oropharynx and mucosa. Normal external appearance of ears and nose.  Neck: Supple, no pain or tenderness  CV: No JVD. No peripheral edema.  Pulmonary: Symmetric Chest rise. Normal respiratory effort.  Abdomen: Soft to touch, non-tender.  Ext: No cyanosis, edema, or deformity  Skin: No rash. Normal palpation of skin.   Musculoskeletal: Normal digits and nails by inspection. No clubbing.   Neurologic Examination  Mental status/Cognition: Alert, oriented to self, place, month and year, good attention.  Speech/language: Fluent, comprehension intact, object naming intact, repetition intact.  Cranial nerves:   CN II Pupils equal and reactive to light, no VF deficits    CN III,IV,VI EOM intact, no nystagmus.  Patient was initially noted to have a slight left gaze  preference which resolved after a few minutes.   CN V normal sensation in V1, V2, and V3 segments bilaterally    CN VII no asymmetry, no nasolabial fold flattening    CN VIII normal hearing to speech    CN IX & X normal palatal elevation, no uvular deviation    CN XI 5/5 head turn and 5/5 shoulder shrug bilaterally    CN XII midline tongue protrusion  Motor:  Muscle bulk: normal, tone normal, pronator drift none tremor none Mvmt Root Nerve  Muscle Right Left Comments  SA C5/6 Ax Deltoid 5 5   EF C5/6 Mc Biceps 5 5   EE C6/7/8 Rad Triceps 5 5   WF C6/7 Med FCR     WE C7/8 PIN ECU     F Ab C8/T1 U ADM/FDI 5 5   HF L1/2/3 Fem Illopsoas 5 5   KE L2/3/4 Fem Quad 5 5   DF L4/5 D Peron Tib Ant 5 5   PF S1/2 Tibial Grc/Sol 5 5    Sensation:  Light touch Intact throughout   Pin prick    Temperature    Vibration   Proprioception    Coordination/Complex Motor:  - Finger to Nose intact bilaterally - Heel to shin intact laterally - Rapid alternating movement are normal - Gait: Deferred for patient safety. Labs/Imaging/Neurodiagnostic studies   CBC:  Recent Labs  Lab Mar 04, 2024 2244 2024/03/04 2249  WBC 11.8*  --   NEUTROABS 8.4*  --   HGB 13.9 15.6*  HCT 43.6 46.0  MCV 88.8  --   PLT 272  --    Basic Metabolic Panel:  Lab Results  Component Value Date   NA 139 03/04/24   K 3.8 2024/03/04   CO2 24 03/04/24   GLUCOSE 368 (H) 2024/03/04   BUN 11 Mar 04, 2024   CREATININE 0.70 04-Mar-2024   CALCIUM  8.6 (L) Mar 04, 2024   GFRNONAA >60 03-04-2024   GFRAA >60 07/01/2019   Lipid Panel: No results found for: LDLCALC HgbA1c:  Lab Results  Component Value Date   HGBA1C 11.6 (H) 12/30/2022   Urine Drug Screen:     Component Value Date/Time   LABOPIA NONE DETECTED 02/13/2024 0031   COCAINSCRNUR NONE DETECTED 02/13/2024 0031   LABBENZ NONE DETECTED 02/13/2024 0031   AMPHETMU NONE DETECTED 02/13/2024 0031   THCU NONE DETECTED 02/13/2024 0031   LABBARB NONE DETECTED  02/13/2024 0031    Alcohol Level     Component Value Date/Time   ETH <15 03-04-24 2244   INR  Lab Results  Component Value Date   INR 0.9 March 04, 2024   APTT  Lab Results  Component Value Date   APTT 24 March 04, 2024   AED levels: No results found for: PHENYTOIN, ZONISAMIDE, LAMOTRIGINE, LEVETIRACETA  CT Head without contrast(Personally reviewed): CTH was negative for a large hypodensity concerning for a large territory infarct or hyperdensity concerning for an ICH  Neurodiagnostics rEEG:  Pending  ASSESSMENT   Lydiann Tamea Patch is a 63 y.o. 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  Patient is now back to her baseline.  CT head without contrast with no acute intracranial normalities.  No obvious provoking risk factors identified.  Patient is compliant with her Keppra .  RECOMMENDATIONS  -Increase Keppra  to 1000 mg twice daily from her home dose of 750 mg twice daily. -Routine EEG. -Observe overnight for seizure clustering.  If no further seizures overnight, patient is cleared from a neurology standpoint for discharge. -Follow-up with her outpatient neurologist at [facility]. - No driving for 6 months.  Patient has to be seizure-free before she can resume driving.  ______________________________________________________________________  Plan discussed with patient, her daughter at  the bedside and with patient's husband.  Signed, Tesslyn Baumert, MD [facility]

## 2024-02-13 NOTE — Code Documentation (Signed)
 Stroke Response Nurse Documentation Code Documentation  Stephanie [name] is a 63 y.o. female arriving to [facility]  via [facility] on 8/13 with past medical hx of Morbid obesity, seizures, HTN, SDH/craniotomy. On No antithrombotic. Code stroke was activated by EMS.   Patient from home where she was LKW at 2110 and now complaining of left sided weakness following a seizure.    Stroke team at the bedside on patient arrival. Labs drawn and patient cleared for CT by Ubaldo High PA. Patient to CT with team. NIHSS 0, see documentation for details and code stroke times. Patient with no deficits on exam. The following imaging was completed:  CT Head. Patient is not a candidate for IV Thrombolytic due to resolved symptoms. Patient is not a candidate for IR due to low suspicion of stroke.   Care Plan: Neuro checks q2 hrs.    Bedside handoff with ED RN Campbell.    Griselda Alm ORN  Rapid Response RN

## 2024-02-13 NOTE — Procedures (Signed)
 Patient Name: Stephanie [name]  MRN: [id number]  Epilepsy Attending: Arlin MALVA Krebs  Referring Physician/Provider: Khaliqdina, Salman, MD  Date: 02/13/2024 Duration: 22.33 mins  Patient history:  63 y.o. 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 EEG to evaluate for seizure  Level of alertness: Awake, asleep  AEDs during EEG study: LEV  Technical aspects: This EEG study was done with scalp electrodes positioned according to the 10-20 International system of electrode placement. Electrical activity was reviewed with band pass filter of 1-70Hz , sensitivity of 7 uV/mm, display speed of 45mm/sec with a 60Hz  notched filter applied as appropriate. EEG data were recorded continuously and digitally stored.  Video monitoring was available and reviewed as appropriate.  Description: The posterior dominant rhythm consists of 9 Hz activity of moderate voltage (25-35 uV) seen predominantly in posterior head regions, symmetric and reactive to eye opening and eye closing. Sleep was characterized by vertex waves, maximal frontocentral region. EEG showed continuous 3 to 6 Hz theta-delta slowing in right hemisphere. Hyperventilation and photic stimulation were not performed.     ABNORMALITY - Continuous slow, right hemisphere  IMPRESSION: This study is suggestive of cortical dysfunction arising from right hemisphere likely secondary to underlying structural abnormality. No seizures or epileptiform discharges were seen throughout the recording.  Solimar Maiden O Neziah Braley

## 2024-05-20 ENCOUNTER — Encounter: Payer: Self-pay | Admitting: Family Medicine

## 2024-05-21 ENCOUNTER — Other Ambulatory Visit: Payer: Self-pay | Admitting: Obstetrics and Gynecology

## 2024-05-21 DIAGNOSIS — Z1231 Encounter for screening mammogram for malignant neoplasm of breast: Secondary | ICD-10-CM

## 2024-06-03 ENCOUNTER — Ambulatory Visit (INDEPENDENT_AMBULATORY_CARE_PROVIDER_SITE_OTHER)
Admission: RE | Admit: 2024-06-03 | Discharge: 2024-06-03 | Disposition: A | Payer: Self-pay | Source: Ambulatory Visit | Attending: Obstetrics and Gynecology | Admitting: Obstetrics and Gynecology

## 2024-06-03 ENCOUNTER — Inpatient Hospital Stay: Payer: Self-pay | Admitting: Hematology and Oncology

## 2024-06-03 VITALS — BP 107/65 | Wt 246.0 lb

## 2024-06-03 DIAGNOSIS — Z1231 Encounter for screening mammogram for malignant neoplasm of breast: Secondary | ICD-10-CM

## 2024-06-03 NOTE — Progress Notes (Signed)
 Ms. Stephanie [name] is a 63 y.o. female who presents to [facility] clinic today with no complaints.    Pap Smear: Pap smear not completed today due to age   Physical exam: Breasts Breasts symmetrical. No skin abnormalities bilateral breasts. No nipple retraction bilateral breasts. No nipple discharge bilateral breasts. No lymphadenopathy. No lumps palpated bilateral breasts.       Pelvic/Bimanual Pap is not indicated today   Smoking History: Patient has never smoked and was not referred to quit line.    Patient Navigation: Patient education provided. Access to services provided for patient through [facility] program. No interpreter provided. No transportation provided   Colorectal Cancer Screening: Per patient has never had colonoscopy completed No complaints today.    Breast and Cervical Cancer Risk Assessment: Patient does not have family history of breast cancer, known genetic mutations, or radiation treatment to the chest before age 69. Patient does not have history of cervical dysplasia, immunocompromised, or DES exposure in-utero.  Risk Scores as of Encounter on 06/03/2024     Stephanie [name]           5-year 1.48%   Lifetime 5.63%   This patient is [place] but has no documented birth country, so the [place] model used data from [place] patients to calculate their risk score. Document a birth country in the Demographics activity for a more accurate score.         Last calculated by Stephanie [name], Stephanie [name], CMA on 06/03/2024 at 12:41 PM        A: BCCCP exam without pap smear No complaints with benign exam.   P: Referred to [facility] for screening mammogram. Appointment scheduled for 06/03/2024  Stephanie [name] LABOR, NP 06/03/2024 1:34 PM

## 2024-06-13 ENCOUNTER — Ambulatory Visit: Payer: Self-pay | Admitting: Obstetrics & Gynecology
